# Patient Record
Sex: Male | Born: 1970 | Race: White | Hispanic: No | Marital: Married | Smoking: Never smoker
Health system: Southern US, Community
[De-identification: ages and names within clinical notes are randomized; demographics above are authoritative.]

## PROBLEM LIST (undated history)

## (undated) DIAGNOSIS — I82409 Acute embolism and thrombosis of unspecified deep veins of unspecified lower extremity: Secondary | ICD-10-CM

## (undated) DIAGNOSIS — R1011 Right upper quadrant pain: Secondary | ICD-10-CM

## (undated) HISTORY — DX: Right upper quadrant pain: R10.11

## (undated) HISTORY — DX: Acute embolism and thrombosis of unspecified deep veins of unspecified lower extremity: I82.409

---

## 2006-12-04 ENCOUNTER — Emergency Department (HOSPITAL_COMMUNITY): Admission: EM | Admit: 2006-12-04 | Discharge: 2006-12-04 | Payer: Self-pay | Admitting: Emergency Medicine

## 2008-06-05 ENCOUNTER — Ambulatory Visit: Payer: Self-pay | Admitting: Internal Medicine

## 2008-06-05 DIAGNOSIS — R1011 Right upper quadrant pain: Secondary | ICD-10-CM | POA: Insufficient documentation

## 2008-06-05 LAB — CONVERTED CEMR LAB: Rapid Strep: NEGATIVE

## 2008-06-06 ENCOUNTER — Encounter: Payer: Self-pay | Admitting: Internal Medicine

## 2008-06-09 ENCOUNTER — Telehealth (INDEPENDENT_AMBULATORY_CARE_PROVIDER_SITE_OTHER): Payer: Self-pay | Admitting: *Deleted

## 2008-06-09 ENCOUNTER — Encounter: Admission: RE | Admit: 2008-06-09 | Discharge: 2008-06-09 | Payer: Self-pay | Admitting: Internal Medicine

## 2008-06-09 DIAGNOSIS — R1011 Right upper quadrant pain: Secondary | ICD-10-CM

## 2008-06-09 HISTORY — DX: Right upper quadrant pain: R10.11

## 2008-06-11 ENCOUNTER — Encounter (INDEPENDENT_AMBULATORY_CARE_PROVIDER_SITE_OTHER): Payer: Self-pay | Admitting: *Deleted

## 2008-06-11 ENCOUNTER — Telehealth: Payer: Self-pay | Admitting: Internal Medicine

## 2008-06-12 ENCOUNTER — Ambulatory Visit: Payer: Self-pay | Admitting: Internal Medicine

## 2008-06-12 ENCOUNTER — Telehealth (INDEPENDENT_AMBULATORY_CARE_PROVIDER_SITE_OTHER): Payer: Self-pay | Admitting: *Deleted

## 2008-07-09 ENCOUNTER — Ambulatory Visit: Payer: Self-pay | Admitting: Internal Medicine

## 2008-07-17 LAB — CONVERTED CEMR LAB
ALT: 43 units/L (ref 0–53)
AST: 26 units/L (ref 0–37)
BUN: 14 mg/dL (ref 6–23)
Calcium: 9.4 mg/dL (ref 8.4–10.5)
Creatinine, Ser: 1 mg/dL (ref 0.4–1.5)
GFR calc non Af Amer: 89.26 mL/min (ref >60)
HDL: 33.7 mg/dL — ABNORMAL LOW (ref >39.00)
LDL Cholesterol: 110 mg/dL — ABNORMAL HIGH (ref 0–99)
PSA: 0.71 ng/mL (ref 0.10–4.00)
RBC: 4.94 M/uL (ref 4.22–5.81)
Sodium: 141 meq/L (ref 135–145)
Total CHOL/HDL Ratio: 5
Total Protein: 7.1 g/dL (ref 6.0–8.3)
WBC: 6.9 10*3/uL (ref 4.5–10.5)

## 2008-09-19 ENCOUNTER — Ambulatory Visit: Payer: Self-pay | Admitting: Internal Medicine

## 2008-10-02 ENCOUNTER — Ambulatory Visit: Payer: Self-pay | Admitting: Internal Medicine

## 2008-10-08 ENCOUNTER — Emergency Department (HOSPITAL_COMMUNITY): Admission: EM | Admit: 2008-10-08 | Discharge: 2008-10-08 | Payer: Self-pay | Admitting: Emergency Medicine

## 2008-10-16 ENCOUNTER — Encounter: Payer: Self-pay | Admitting: Internal Medicine

## 2010-03-11 DIAGNOSIS — I82409 Acute embolism and thrombosis of unspecified deep veins of unspecified lower extremity: Secondary | ICD-10-CM

## 2010-03-11 HISTORY — DX: Acute embolism and thrombosis of unspecified deep veins of unspecified lower extremity: I82.409

## 2010-03-25 ENCOUNTER — Emergency Department (HOSPITAL_BASED_OUTPATIENT_CLINIC_OR_DEPARTMENT_OTHER)
Admission: EM | Admit: 2010-03-25 | Discharge: 2010-03-25 | Payer: Self-pay | Source: Home / Self Care | Admitting: Emergency Medicine

## 2010-03-31 ENCOUNTER — Encounter: Payer: Self-pay | Admitting: Internal Medicine

## 2010-03-31 ENCOUNTER — Ambulatory Visit: Payer: Self-pay | Admitting: Internal Medicine

## 2010-03-31 ENCOUNTER — Emergency Department (HOSPITAL_COMMUNITY)
Admission: EM | Admit: 2010-03-31 | Discharge: 2010-03-31 | Payer: Self-pay | Source: Home / Self Care | Admitting: Emergency Medicine

## 2010-03-31 ENCOUNTER — Ambulatory Visit: Payer: Self-pay

## 2010-03-31 DIAGNOSIS — K649 Unspecified hemorrhoids: Secondary | ICD-10-CM | POA: Insufficient documentation

## 2010-03-31 DIAGNOSIS — M79609 Pain in unspecified limb: Secondary | ICD-10-CM | POA: Insufficient documentation

## 2010-03-31 DIAGNOSIS — K429 Umbilical hernia without obstruction or gangrene: Secondary | ICD-10-CM | POA: Insufficient documentation

## 2010-04-01 ENCOUNTER — Telehealth: Payer: Self-pay | Admitting: Internal Medicine

## 2010-04-02 ENCOUNTER — Ambulatory Visit: Payer: Self-pay | Admitting: Internal Medicine

## 2010-04-02 DIAGNOSIS — I82409 Acute embolism and thrombosis of unspecified deep veins of unspecified lower extremity: Secondary | ICD-10-CM | POA: Insufficient documentation

## 2010-04-05 ENCOUNTER — Emergency Department (HOSPITAL_COMMUNITY)
Admission: EM | Admit: 2010-04-05 | Discharge: 2010-04-05 | Payer: Self-pay | Source: Home / Self Care | Admitting: Emergency Medicine

## 2010-04-06 ENCOUNTER — Ambulatory Visit: Payer: Self-pay | Admitting: Internal Medicine

## 2010-04-06 LAB — CONVERTED CEMR LAB
AST: 27 units/L (ref 0–37)
BUN: 17 mg/dL (ref 6–23)
Bilirubin, Direct: 0.1 mg/dL (ref 0.0–0.3)
CO2: 30 meq/L (ref 19–32)
Chloride: 103 meq/L (ref 96–112)
Direct LDL: 130.5 mg/dL
Eosinophils Relative: 1.4 % (ref 0.0–5.0)
Glucose, Bld: 72 mg/dL (ref 70–99)
HDL: 32.7 mg/dL — ABNORMAL LOW (ref >39.00)
INR: 2
Lymphocytes Relative: 38.2 % (ref 12.0–46.0)
Lymphs Abs: 3.7 10*3/uL (ref 0.7–4.0)
Monocytes Absolute: 0.7 10*3/uL (ref 0.1–1.0)
Monocytes Relative: 7.5 % (ref 3.0–12.0)
Neutro Abs: 5.1 10*3/uL (ref 1.4–7.7)
Neutrophils Relative %: 52.5 % (ref 43.0–77.0)
Potassium: 4.2 meq/L (ref 3.5–5.1)
Sodium: 140 meq/L (ref 135–145)
Total CHOL/HDL Ratio: 6
WBC: 9.8 10*3/uL (ref 4.5–10.5)

## 2010-04-08 ENCOUNTER — Ambulatory Visit
Admission: RE | Admit: 2010-04-08 | Discharge: 2010-04-08 | Payer: Self-pay | Source: Home / Self Care | Attending: Internal Medicine | Admitting: Internal Medicine

## 2010-04-08 LAB — CONVERTED CEMR LAB: INR: 2.2

## 2010-04-13 ENCOUNTER — Ambulatory Visit
Admission: RE | Admit: 2010-04-13 | Discharge: 2010-04-13 | Payer: Self-pay | Source: Home / Self Care | Attending: Internal Medicine | Admitting: Internal Medicine

## 2010-04-13 DIAGNOSIS — Z7901 Long term (current) use of anticoagulants: Secondary | ICD-10-CM

## 2010-04-13 LAB — CONVERTED CEMR LAB: INR: 2.4

## 2010-04-22 ENCOUNTER — Ambulatory Visit
Admission: RE | Admit: 2010-04-22 | Discharge: 2010-04-22 | Payer: Self-pay | Source: Home / Self Care | Attending: Internal Medicine | Admitting: Internal Medicine

## 2010-05-02 ENCOUNTER — Encounter: Payer: Self-pay | Admitting: Internal Medicine

## 2010-05-03 ENCOUNTER — Telehealth: Payer: Self-pay | Admitting: Internal Medicine

## 2010-05-07 ENCOUNTER — Ambulatory Visit
Admission: RE | Admit: 2010-05-07 | Discharge: 2010-05-07 | Payer: Self-pay | Source: Home / Self Care | Attending: Internal Medicine | Admitting: Internal Medicine

## 2010-05-13 NOTE — Assessment & Plan Note (Signed)
Summary: PT check/drb  Nurse Visit   Vital Signs:  Patient profile:   40 year old male Height:      69 inches (175.26 cm) Weight:      203.25 pounds (92.39 kg) BMI:     30.12 Temp:     98.4 degrees F (36.89 degrees C) oral BP sitting:   110 / 70  (left arm) Cuff size:   large  Vitals Entered By: [name] CMA (April 02, 2010 10:34 AM) CC: PT check./kb   Current Medications (verified): 1)  Coumadin 5 Mg Tabs (Warfarin Sodium) .Marland Kitchen.. 1 By Mouth Once Daily 2)  Lovenox 100 Mg/ml Soln (Enoxaparin Sodium) .... As Directed  Allergies: No Known Drug Allergies Laboratory Results   Blood Tests   Date/Time Received: [name] [facility]  April 02, 2010 10:34 AM  Date/Time Reported: [name] CMA  April 02, 2010 10:35 AM    INR: 1.1   (Normal Range: 0.88-1.12   Therap INR: 2.0-3.5) Comments: Patient ntoes that he did get the Lovenox. He took 100 this AM, 100 last night, and had 140 the night before. He is also taking Coumadin 5mg  1 by mouth once daily. Per MD the patient was advised to continue the same and re-check Tues. morning. [name] CMA  April 02, 2010 10:36 AM     Orders Added: 1)  Est. Patient Level I [99211] 2)  Protime [16109UE]

## 2010-05-13 NOTE — Miscellaneous (Signed)
Summary: Orders Update  Clinical Lists Changes  Orders: Added new Test order of Venous Duplex Lower Extremity (Venous Duplex Lower) - Signed 

## 2010-05-13 NOTE — Progress Notes (Signed)
Summary: Lovenox pa   Phone Note Call from Patient Call back at Home Phone [phone]   Summary of Call: Patient spouse called stating that insurance requires prior authorization for Lovenox. I spoke with pharmacist who will get clarification of the prescription from hospitalist and send that info (sig instructions, because paz is not here to advise) as well as prior authorization info to our office. Patient spouse is aware that we will do prior authorization, hospitalists do not do that. Initial call taken by: [name] CMA,  April 01, 2010 4:06 PM Initial call taken by: [name] CMA,  April 01, 2010 4:02 PM  Follow-up for Phone Call        Patient came in for PT check today and notes that this was taken care of and he did get the Lovenox. No further action required. Follow-up by: [name] CMA,  April 02, 2010 10:33 AM

## 2010-05-13 NOTE — Assessment & Plan Note (Signed)
Summary: pt check///sph  Nurse Visit   Vital Signs:  Patient profile:   40 year old male Weight:      206.0 pounds Temp:     98.5 degrees F oral BP sitting:   118 / 80  (left arm) Cuff size:   large  Vitals Entered By: Almeta Monas CMA Duncan Dull) (April 08, 2010 9:30 AM)  Current Medications (verified): 1)  Coumadin 5 Mg Tabs (Warfarin Sodium) .Marland Kitchen.. 1 By Mouth Once Daily  Allergies (verified): No Known Drug Allergies Laboratory Results   Blood Tests      INR: 2.2   (Normal Range: 0.88-1.12   Therap INR: 2.0-3.5)    Orders Added: 1)  Est. Patient Level I [16109] 2)  Protime [60454UJ]   ANTICOAGULATION RECORD PREVIOUS REGIMEN & LAB RESULTS   Previous INR:  2.0 on  04/06/2010    NEW REGIMEN & LAB RESULTS Anticoag. Dx: Deep venous thrombosis Current INR: 2.2 Current Coumadin Dose(mg): 5 mg coumadin-1 tab daily Regimen: same  [place]: Willow Ora Repeat testing in: on Tues 04/13/10 Other Comments:    Dose has been reviewed with patient or caretaker during this visit. Reviewed by: Almeta Monas

## 2010-05-13 NOTE — Assessment & Plan Note (Signed)
Summary: pt check///sph  Nurse Visit   Vital Signs:  Patient profile:   40 year old male Height:      69 inches Weight:      204.38 pounds Temp:     98.6 degrees F oral CC: PT check./kb   Allergies: No Known Drug Allergies Laboratory Results   Blood Tests   Date/Time Received: [name] [facility]  April 06, 2010 9:42 AM Date/Time Reported: [name] CMA  April 06, 2010 9:42 AM    INR: 2.0   (Normal Range: 0.88-1.12   Therap INR: 2.0-3.5) Comments: Patient has 5mg  tabs and takes 1 by mouth once daily. He took his last Lovenox shot this AM. He was seen at the ER last night and his level was 1.45. Per MD the patient was made aware to stop the shots, continue same dose of coumadin and re-check in 2 days. [name] CMA  April 06, 2010 9:53 AM     Orders Added: 1)  Est. Patient Level I [99211] 2)  Protime [54098JX]

## 2010-05-13 NOTE — Progress Notes (Signed)
Summary: rehab question  Phone Note Call from Patient Call back at Home Phone [phone]   Summary of Call: Patient left message on triage asking when could he begin rehab for his torn calf/DVT. Please advise. Initial call taken by: [name] CMA,  May 03, 2010 1:21 PM  Follow-up for Phone Call        please arrange a OV, Ilike to see how he is doing [place] E. Kourtni Stineman MD  May 03, 2010 2:36 PM   Additional Follow-up for Phone Call Additional follow up Details #1::        Patient will come on thurs. Additional Follow-up by: [name] CMA,  May 03, 2010 2:59 PM

## 2010-05-13 NOTE — Progress Notes (Signed)
Summary: INR check  Phone Note Outgoing Call   Summary of Call: please check on patient  was seen in the ER and discharged home. was prescribed Lovenox and reportedly Coumadin. Needs an INR check tomorrow Jose E. Paz MD  April 01, 2010 10:28 AM   Follow-up for Phone Call        I spoke w/ pt and he is doing okay coming in tomorrow to check PT .Army Fossa CMA  April 01, 2010 11:14 AM

## 2010-05-13 NOTE — Assessment & Plan Note (Signed)
Summary: pt check and questions  Nurse Visit   Vital Signs:  Patient profile:   40 year old male Height:      69 inches (175.26 cm) Weight:      208 pounds (94.55 kg) BMI:     30.83 Temp:     98.3 degrees F (36.83 degrees C) oral BP sitting:   110 / 72  (left arm) Cuff size:   regular  Vitals Entered By: [name] CMA (April 13, 2010 9:40 AM) CC: PT check./kb   Allergies: No Known Drug Allergies Laboratory Results   Blood Tests   Date/Time Received: [name] [facility]  April 13, 2010 9:40 AM Date/Time Reported: [name] CMA  April 13, 2010 9:40 AM    INR: 2.4   (Normal Range: 0.88-1.12   Therap INR: 2.0-3.5) Comments: Patient notes that he has 5mg  tabs at home and takes one daily. He also would like to know if there is anything that needs to be done before [place] leaves the [place] (i.e. INR check, repeat u/s to make sure clot has dissolved). Patient also notes that he has foot pain when walking (5 of 10 on pain scale) and he notes that he did not have this previously, so he would like to know if it is coming from the clot in his leg. Please advise. [name] CMA  April 13, 2010 9:42 AM  continue with the same dose of Coumadin, recheck INR in 10 days. I don't think he needs a new ultrasound unless the leg swelling is getting worse. For pain, take Tylenol as needed. remind  patient he cannot take Motrin or Motrin-like medicine while he's on Coumadin. for how long is he going out of the country? he needs his Coumadin check in 10 days ! [name] MD  April 14, 2010 10:26 AM    Patient notified. [name] CMA  April 14, 2010 10:59 AM

## 2010-05-13 NOTE — Assessment & Plan Note (Signed)
Summary: PT check/kb  Nurse Visit   Vital Signs:  Patient profile:   40 year old male Height:      69 inches (175.26 cm) Weight:      206.38 pounds (93.81 kg) BMI:     30.59 Temp:     98.1 degrees F (36.72 degrees C) oral BP sitting:   116 / 80  (left arm) Cuff size:   large  Vitals Entered By: [name] CMA (April 22, 2010 10:02 AM) CC: PT check./kb   Allergies: No Known Drug Allergies Laboratory Results   Blood Tests   Date/Time Received: [name] [facility]  April 22, 2010 10:03 AM  Date/Time Reported: [name] CMA  April 22, 2010 10:03 AM    INR: 3.4   (Normal Range: 0.88-1.12   Therap INR: 2.0-3.5) Comments: Patient notes that he has 5mg  tabs and takes 1 tab at bedtime (35mg /week). He is also getting ready to leave the country for 10 days, and is aware I will call him with MD recommendations at [phone]. Please advise. [name] CMA  April 22, 2010 10:05 AM  new dose, 5 mg tablets: One tablet daily, half tablet Tuesday and Fridays (30mg  /week) Recheck in 2 weeks Kazaria Gaertner E. Jakaila Norment MD  April 22, 2010 1:22 PM   Patient notified. [name] CMA  April 22, 2010 4:05 PM     Orders Added: 1)  Est. Patient Level I [95284] 2)  Protime [[id number]] Prescriptions: COUMADIN 5 MG TABS (WARFARIN SODIUM) 1 by mouth once daily  #30 x 3   Entered by:   [name] CMA   Authorized by:   Nolon Rod. Kammi Hechler MD   Signed by:   [name] CMA on 04/22/2010   Method used:   Electronically to        [facility]* (retail)       [address] [place], [place]  [address]       Ph: [phone] or [phone]       Fax: [phone]   RxID:   [id number]

## 2010-05-13 NOTE — Assessment & Plan Note (Signed)
Summary: CPX/CBS   Vital Signs:  Patient profile:   40 year old male Height:      69 inches Weight:      205.38 pounds BMI:     30.44 Pulse rate:   62 / minute Pulse rhythm:   regular BP sitting:   128 / 84  (left arm) Cuff size:   large  Vitals Entered By: Army Fossa CMA (March 31, 2010 1:14 PM) CC: CPX, fasting, Back Pain Comments went to ER was told calf muscle was torn in (L) leg. Happened last thursday [facility]   History of Present Illness: CPX in addition:  worried about a umbilical hernia, going on for a while, no pain.  has hemorrhoids,   from time to time they hurt, burn, get bigger and harder . very rarely he sees some blood  went to ER was told calf muscle was torn in (L) leg. Happened last week, inmediately after he started  to play  basketball  continue w/ pain , has developed swelling     Preventive Screening-Counseling & Management  Caffeine-Diet-Exercise     Does Patient Exercise: no  Current Medications (verified): 1)  None  Allergies (verified): No Known Drug Allergies  Past History:  Past Medical History: Reviewed history from 09/19/2008 and no changes required. h/o Guillain-Barr   (1998) RUQ pain: 3-10 u/s neg   Past Surgical History: Reviewed history from 06/05/2008 and no changes required. 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 CV:  Denies chest pain or discomfort and palpitations. Resp:  Denies cough and shortness of breath. GI:  Denies diarrhea, nausea, and vomiting; RUQ still present on-off . GU:  Denies dysuria, hematuria, urinary frequency, and urinary hesitancy. Psych:  Denies anxiety and  depression.  Physical Exam  General:  alert, well-developed, and overweight-appearing.   Neck:  no masses and no thyromegaly.   Lungs:  normal respiratory effort, no intercostal retractions, no accessory muscle use, and normal breath sounds.   Heart:  normal rate, regular rhythm, no murmur, and no gallop.   Abdomen:  soft, non-tender, no distention, no masses, no guarding, and no rigidity.  has a 3/4 inch umbilical hernia, reducible, nontender Rectal:  2 very small external hemorrhoids noted. Normal sphincter tone. No rectal masses or tenderness. Anoscopy--- 3 internal hemorrhoids, less than half centimeter in size Prostate:  Prostate gland firm and smooth, no enlargement, nodularity, tenderness, mass, asymmetry or induration. Extremities:  left calf a few millimeters greater than the right, slightly tender to palpation. Distal pretibial area on the left is slightly swollen. Right Normal No rash on either side   Impression & Recommendations:  Problem # 1:  HEALTH SCREENING (ICD-V70.0) Td-- 2002 Flu shot-- we don't have any available, recommend to talk  with his pharmacyst  if he desires to have one Diet and exercise discussed Labs  Problem # 2:  LEG PAIN (ICD-729.5) muscle injury? DVT? Plan ---- ultrasound, if negative, refer  2 orthopedic surgery  Orders: Radiology Referral (Radiology)  Problem # 3:  UMBILICAL HERNIA (ICD-553.1) symptoms of incarceration discussed , knows to go to the ER if ever happens At this  point, surgery is elective, he is encouraged to call me if he decides to have that done. Also to call me if the size increase  Problem # 4:  HEMORRHOIDS (ICD-455.6) he has internal hemorrhoids, see instructions  Problem # 5:  RUQ PAIN (ICD-789.01) still has pain from time to time, he will call if ever decides to have a HIDA scan done  Problem # 6:  addendum ultrasound show a left leg DVT that goes beyond the knee. I spoke with the patient I recommend him to go to  the ER at [facility] In my opinion, he needs to be admitted and treated ( or at least be sure he has Lovenox and start Coumadin) Will call the ER and send them  this note Jose E. Paz MD  March 31, 2010 4:27 PM   I spoke with Martie Lee the charge nurse and she is aware that pt is on his way. Faxed over OV. Army Fossa CMA  March 31, 2010 4:31 PM  Other Orders: Venipuncture [id number]) TLB-BMP (Basic Metabolic Panel-BMET) (80048-METABOL) TLB-CBC Platelet - w/Differential (85025-CBCD) TLB-Lipid Panel (80061-LIPID) TLB-TSH (Thyroid Stimulating Hormone) (84443-TSH) TLB-Hepatic/Liver Function Pnl (80076-HEPATIC) Specimen Handling (98119)  Patient Instructions: 1)  Metamucil 2 capsules every day with fluids 2)  If hemorrhoidal discomfort, get over-the-counter nupercainal and hydrocortisone 1%-----> mixed in half-and-half and use it as needed 3)  Call if the hemorrhoidal discomfort is severe 4)  Please schedule a follow-up appointment in 1 year.    Orders Added: 1)  Venipuncture [36415] 2)  TLB-BMP (Basic Metabolic Panel-BMET) [80048-METABOL] 3)  TLB-CBC Platelet - w/Differential [85025-CBCD] 4)  TLB-Lipid Panel [80061-LIPID] 5)  TLB-TSH (Thyroid Stimulating Hormone) [84443-TSH] 6)  TLB-Hepatic/Liver Function Pnl [80076-HEPATIC] 7)  Specimen Handling [99000] 8)  Radiology Referral [Radiology] 9)  Est. Patient Level V [14782] 10)  Est. Patient age 27-39 [1]     Risk Factors:  Exercise:  no

## 2010-05-18 ENCOUNTER — Ambulatory Visit: Payer: 59 | Attending: Internal Medicine | Admitting: Physical Therapy

## 2010-05-18 DIAGNOSIS — M25569 Pain in unspecified knee: Secondary | ICD-10-CM | POA: Insufficient documentation

## 2010-05-18 DIAGNOSIS — IMO0001 Reserved for inherently not codable concepts without codable children: Secondary | ICD-10-CM | POA: Insufficient documentation

## 2010-05-18 DIAGNOSIS — M25669 Stiffness of unspecified knee, not elsewhere classified: Secondary | ICD-10-CM | POA: Insufficient documentation

## 2010-05-19 ENCOUNTER — Encounter: Payer: Self-pay | Admitting: Internal Medicine

## 2010-05-19 NOTE — Assessment & Plan Note (Signed)
Summary: DISCUSS REHAB AND PT CHECK/KB   Vital Signs:  Patient profile:   40 year old male Weight:      206 pounds Pulse rate:   79 / minute BP sitting:   124 / 82  (left arm) Cuff size:   large  Vitals Entered By: Army Fossa CMA (May 07, 2010 8:52 AM) CC: Pt here for f/u visit, PT Check Comments doing better [facility]    History of Present Illness: here for a recheck Doing well Swelling is decreased Now he notices that his left calf is smaller  ROS Tolerating Coumadin well, denies blood in stools or blood in the urine last month, he went to the ER with chest pain, symptoms resolve  Current Medications (verified): 1)  Coumadin 5 Mg Tabs (Warfarin Sodium) .Marland Kitchen.. 1 By Mouth Once Daily  Allergies (verified): No Known Drug Allergies  Past History:  Past Medical History: h/o Guillain-Barr   (1998) RUQ pain: 3-10 u/s neg  DVT L Leg 12-11  Past Surgical History: Reviewed history from 06/05/2008 and no changes required. Denies surgical history  Social History: Reviewed history from 03/31/2010 and no changes required. 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   Extremities:  right leg normal Left leg: No rash No swelling left calf does seem  smaller compared to the right. palpation of the calf muscles and Achilles tendon is within normal limits   Impression & Recommendations:  Problem # 1:  DVT (ICD-453.40) patient had DVT one week after I sprain in the calf, no history of previous DVTs. although the DVT was "provoked", it was provoked by a very mild sprain. I am recommending a total of 4-5 months of Coumadin. Discontinue Coumadin   by May 2012 I also discussed with the patient that he is from now on a higher risk for DVTs and PEs, discussed with him the situations that  increase his risk and the  strategies to prevent it.  Orders: Protime (81191YN)  Problem # 2:  LEG PAIN (ICD-729.5)  he had a sprain in the left calf, now the muscles are smaller. Refer to PT  Orders: Physical Therapy Referral (PT)  Complete Medication List: 1)  Coumadin 5 Mg Tabs (Warfarin sodium) .Marland Kitchen.. 1 by mouth once daily  Patient Instructions: 1)  same coumadin  2)  next INR check in 3 weeks    Orders Added: 1)  Protime [82956OZ] 2)  Physical Therapy Referral [PT] 3)  Est. Patient Level III [99213]      Laboratory Results   Blood Tests      INR: 2.1   (Normal Range: 0.88-1.12   Therap INR: 2.0-3.5) Comments: 5 mg tabs takes 1 tab daily except Tues Fri takes 1/2 tab  -------------------------------- recheck in 3 weeks  same dose  will keep the patient on Coumadin until May 2012, a total of 5 months of therapy Macyn Remmert E. Elinda Bunten MD  May 07, 2010 9:06 AM

## 2010-05-26 ENCOUNTER — Ambulatory Visit: Payer: 59 | Admitting: Physical Therapy

## 2010-05-28 ENCOUNTER — Encounter: Payer: Self-pay | Admitting: Internal Medicine

## 2010-05-28 ENCOUNTER — Ambulatory Visit (INDEPENDENT_AMBULATORY_CARE_PROVIDER_SITE_OTHER): Payer: 59

## 2010-05-28 DIAGNOSIS — Z5181 Encounter for therapeutic drug level monitoring: Secondary | ICD-10-CM

## 2010-05-28 DIAGNOSIS — I82409 Acute embolism and thrombosis of unspecified deep veins of unspecified lower extremity: Secondary | ICD-10-CM

## 2010-05-28 DIAGNOSIS — Z7901 Long term (current) use of anticoagulants: Secondary | ICD-10-CM

## 2010-06-01 ENCOUNTER — Encounter: Payer: 59 | Admitting: Physical Therapy

## 2010-06-02 NOTE — Miscellaneous (Signed)
Summary: Initial Summary for PT Services/[facility]  Initial Summary for PT Services/[facility]   Imported By: Maryln Gottron 05/24/2010 14:12:05  _____________________________________________________________________  External Attachment:    Type:   Image     Comment:   External Document

## 2010-06-02 NOTE — Medication Information (Signed)
Summary: PT CHECK///SPH  Anticoagulant Therapy  PCP: Willow Ora Indication 1: Deep venous thrombosis  Vital Signs: Weight: 207 lbs.  Blood Pressure:  110 / 70   Dietary changes: no    Health status changes: no    Bleeding/hemorrhagic complications: no    Recent/future hospitalizations: no    Any changes in medication regimen? no    Recent/future dental: no  Any missed doses?: yes     Details: Possibly missed a half dose because the pt awoke at 4 am and took on 0.5 dose.  Is patient compliant with meds? yes      Comments: Level was 2.0, patient notes that he has 5mg  tabs and takes 1 tab daily EXCEPT for Tues & Fri when he only takes 0.5 tabs. [name] CMA  May 28, 2010 9:11 AM  no change,next INR 4 weeks ------------------------------[facility] E. Paz MD  May 28, 2010 2:11 PM   I spoke w/ pt he is aware. Army Fossa CMA  May 28, 2010 2:17 PM  .  Allergies: No Known Drug Allergies  Anticoagulation Management History:      Negative risk factors for bleeding include an age less than 7 years old.  The bleeding index is 'low risk'.  Negative CHADS2 values include Age > 69 years old.  His last INR was 2.1 and today's INR is 2.0.    Anticoagulation Management Assessment/Plan:      The patient's current anticoagulation dose is Coumadin 5 mg tabs: 1 by mouth once daily.  The next INR is due on Tues 04/13/10.        Laboratory Results   Blood Tests   Date/Time Received: [name] [facility]  May 28, 2010 9:12 AM  Date/Time Reported: [name] CMA  May 28, 2010 9:12 AM    INR: 2.0   (Normal Range: 0.88-1.12   Therap INR: 2.0-3.5) Comments: Patient notes he has 5mg  tabs at home and takes one tab daily EXCEPT for Tue & Fri when he takes only 0.5 tabs. Patient notes that he went to sleep one night before taking his med and awoke at 4am and took 0.5 tab, so he possibly missed 0.5 dose. He is aware that we will call him with MD recommendations. [name] CMA   May 28, 2010 9:13 AM

## 2010-06-07 ENCOUNTER — Ambulatory Visit: Payer: 59 | Admitting: Physical Therapy

## 2010-06-08 ENCOUNTER — Telehealth: Payer: Self-pay | Admitting: Internal Medicine

## 2010-06-14 ENCOUNTER — Ambulatory Visit: Payer: 59 | Attending: Internal Medicine | Admitting: Physical Therapy

## 2010-06-14 DIAGNOSIS — IMO0001 Reserved for inherently not codable concepts without codable children: Secondary | ICD-10-CM | POA: Insufficient documentation

## 2010-06-14 DIAGNOSIS — M25669 Stiffness of unspecified knee, not elsewhere classified: Secondary | ICD-10-CM | POA: Insufficient documentation

## 2010-06-14 DIAGNOSIS — M25569 Pain in unspecified knee: Secondary | ICD-10-CM | POA: Insufficient documentation

## 2010-06-17 NOTE — Progress Notes (Signed)
Summary: alka seltzer  Phone Note Call from Patient   Complaint: Earache/Ear Infection Summary of Call: Patient left message on triage that he takes coumadin and would like to know if he can use Alka Seltzer daytime or nightime for cold. Please advise. Initial call taken by: [name] CMA,  June 08, 2010 11:12 AM  Follow-up for Phone Call        no Alka Seltzer  or Motrin or Advil,  rather use  rest, fluids, Tylenol and Robitussin-DM. Follow-up by: Nolon Rod. Askari Kinley MD,  June 08, 2010 1:03 PM  Additional Follow-up for Phone Call Additional follow up Details #1::        Patient notified. [name] CMA  June 08, 2010 2:22 PM

## 2010-06-21 ENCOUNTER — Ambulatory Visit: Payer: 59 | Admitting: Physical Therapy

## 2010-06-21 LAB — BASIC METABOLIC PANEL
BUN: 11 mg/dL (ref 6–23)
Creatinine, Ser: 0.95 mg/dL (ref 0.4–1.5)
GFR calc Af Amer: >60 mL/min (ref >60)
Glucose, Bld: 96 mg/dL (ref 70–99)

## 2010-06-21 LAB — DIFFERENTIAL
Eosinophils Relative: 2 % (ref 0–5)
Neutro Abs: 2.9 10*3/uL (ref 1.7–7.7)
Neutrophils Relative %: 42 % — ABNORMAL LOW (ref 43–77)

## 2010-06-21 LAB — CBC
HCT: 47.3 % (ref 39.0–52.0)
Hemoglobin: 16.2 g/dL (ref 13.0–17.0)
MCH: 30.4 pg (ref 26.0–34.0)
MCV: 88.7 fL (ref 78.0–100.0)
Platelets: 319 10*3/uL (ref 150–400)
RBC: 5.33 MIL/uL (ref 4.22–5.81)

## 2010-06-21 LAB — POCT CARDIAC MARKERS
CKMB, poc: <1 ng/mL — ABNORMAL LOW (ref 1.0–8.0)
Myoglobin, poc: 46.7 ng/mL (ref 12–200)

## 2010-06-25 ENCOUNTER — Ambulatory Visit: Payer: 59

## 2010-06-25 ENCOUNTER — Telehealth: Payer: Self-pay | Admitting: Internal Medicine

## 2010-06-28 ENCOUNTER — Encounter: Payer: Self-pay | Admitting: Internal Medicine

## 2010-06-28 ENCOUNTER — Ambulatory Visit (INDEPENDENT_AMBULATORY_CARE_PROVIDER_SITE_OTHER): Payer: 59

## 2010-06-28 ENCOUNTER — Telehealth: Payer: Self-pay | Admitting: Internal Medicine

## 2010-06-28 ENCOUNTER — Ambulatory Visit: Payer: 59 | Admitting: Physical Therapy

## 2010-06-28 DIAGNOSIS — Z5181 Encounter for therapeutic drug level monitoring: Secondary | ICD-10-CM

## 2010-06-28 DIAGNOSIS — I82409 Acute embolism and thrombosis of unspecified deep veins of unspecified lower extremity: Secondary | ICD-10-CM

## 2010-06-28 DIAGNOSIS — Z7901 Long term (current) use of anticoagulants: Secondary | ICD-10-CM

## 2010-06-28 LAB — CONVERTED CEMR LAB: POC INR: 1.8

## 2010-06-29 NOTE — Telephone Encounter (Signed)
error 

## 2010-07-05 ENCOUNTER — Ambulatory Visit: Payer: 59 | Admitting: Physical Therapy

## 2010-07-08 NOTE — Progress Notes (Signed)
  Phone Note Outgoing Call   Summary of Call: due INR,  please arrange Jose E. Paz MD  June 25, 2010 6:18 PM   Follow-up for Phone Call        has appt today. Army Fossa CMA  June 28, 2010 8:53 AM

## 2010-07-08 NOTE — Medication Information (Signed)
Summary: Pt check   PCP: Willow Ora Indication 1: Deep venous thrombosis INR POC 1.8  Vital Signs: Weight: 207 lbs.  Blood Pressure:  116 / 70   Dietary changes: no    Health status changes: no    Bleeding/hemorrhagic complications: no    Recent/future hospitalizations: no    Any changes in medication regimen? no    Recent/future dental: no  Any missed doses?: no       Is patient compliant with meds? yes      Comments: Current dose: 5mg  tabs- 1 daily except for 1/2 on Tues. & Fri.....(30mg Tina Griffiths CMA  June 28, 2010 9:00 AM  pls advise -------------------------------------------- new dose 5mg  tab 1 by mouth once daily 53mg /w) , next INR 2 weeks  Greg Clark E. Kourtni Stineman MD  July 01, 2010 1:54   spoke w/ patient aware of instructions...Marland KitchenMarland KitchenDoristine Devoid CMA  July 01, 2010 2:21 PM   Allergies: No Known Drug Allergies  Anticoagulation Management History:      Negative risk factors for bleeding include an age less than 42 years old.  The bleeding index is 'low risk'.  Negative CHADS2 values include Age > 53 years old.  His last INR was 2.0.  INR POC: 1.8.    Anticoagulation Management Assessment/Plan:      The patient's current anticoagulation dose is Coumadin 5 mg tabs: 1 by mouth once daily.  The next INR is due on Tues 04/13/10.

## 2010-07-22 ENCOUNTER — Ambulatory Visit (INDEPENDENT_AMBULATORY_CARE_PROVIDER_SITE_OTHER): Payer: 59 | Admitting: *Deleted

## 2010-07-22 DIAGNOSIS — I82409 Acute embolism and thrombosis of unspecified deep veins of unspecified lower extremity: Secondary | ICD-10-CM

## 2010-07-22 DIAGNOSIS — Z7901 Long term (current) use of anticoagulants: Secondary | ICD-10-CM

## 2010-07-22 LAB — POCT INR: INR: 2.2

## 2010-07-22 NOTE — Patient Instructions (Addendum)
Pt advised no change recheckc 4 weeks.  -------------------------------------------------------- Agree, recheck in 1 month. To discontinue coumadin by 08-2010 [facility]

## 2010-09-10 ENCOUNTER — Ambulatory Visit (INDEPENDENT_AMBULATORY_CARE_PROVIDER_SITE_OTHER): Payer: 59 | Admitting: Internal Medicine

## 2010-09-10 ENCOUNTER — Encounter: Payer: Self-pay | Admitting: Internal Medicine

## 2010-09-10 DIAGNOSIS — Z7901 Long term (current) use of anticoagulants: Secondary | ICD-10-CM

## 2010-09-10 DIAGNOSIS — I82409 Acute embolism and thrombosis of unspecified deep veins of unspecified lower extremity: Secondary | ICD-10-CM

## 2010-09-10 DIAGNOSIS — M79609 Pain in unspecified limb: Secondary | ICD-10-CM

## 2010-09-10 LAB — POCT INR: INR: 2.7

## 2010-09-10 NOTE — Progress Notes (Signed)
  Subjective:    Patient ID: Greg Clark, male    DOB: [birth year], 40 y.o.   MRN: [id number]  HPI To discuss discontinuation of Coumadin. Feels well.  Past Medical History  Diagnosis Date  . Right upper quadrant pain 3/10    u/s neg  . DVT (deep venous thrombosis) 12/11    L leg   No past surgical history on file.   Review of Systems No nausea, vomiting, blood in the stools. No chest pain or shortness of breath.    Objective:   Physical Exam Alert, oriented, in no apparent distress. Inspection and palpation all day low extremities essentially normal. I meassured the calves, the R calf and  is 1/5 inch larger than the L (he has been using his right calf more lately)       Assessment & Plan:  Today , I spent more than 15 min with the patient, >50% of the time counseling, andreviewing the chart

## 2010-09-10 NOTE — Assessment & Plan Note (Addendum)
Recommend patient to discontinue Coumadin. He had a left leg DVT after a L calf muscle injury consequently this was a provoked DVT (although f/u a minor provocation) Patient aware that from this point on he will be more prone to have DVTs, prevention discussed. He will let me know if he has swelling.

## 2010-09-10 NOTE — Assessment & Plan Note (Addendum)
He had a sprain in the left calf 12-11, had physical therapy 6 times, feels better although not 100%. Occasional pain mostly in the posterior aspect of a proximal left leg. Recommend gradual return to normal exercises

## 2011-01-21 LAB — I-STAT 8, (EC8 V) (CONVERTED LAB)
BUN: 10
Bicarbonate: 25.9 — ABNORMAL HIGH
HCT: 46
Hemoglobin: 15.6
Operator id: 151321
pCO2, Ven: 43.7 — ABNORMAL LOW
pH, Ven: 7.381 — ABNORMAL HIGH

## 2011-01-21 LAB — URINE MICROSCOPIC-ADD ON

## 2011-01-21 LAB — URINALYSIS, ROUTINE W REFLEX MICROSCOPIC
Protein, ur: NEGATIVE
Specific Gravity, Urine: 1.026
Urobilinogen, UA: 0.2
pH: 5.5

## 2011-03-15 ENCOUNTER — Encounter: Payer: 59 | Admitting: Internal Medicine

## 2011-05-05 IMAGING — CT CT ANGIO CHEST
2 of 6 series · 19 of 36 positions shown · IV contrast (APPLIED)
Comparison: 04/05/2010.

CLINICAL DATA: Chest pain.  Pulmonary embolism.

CT ANGIOGRAPHY CHEST WITH CONTRAST
TECHNIQUE: Multidetector CT imaging of the chest was performed
using the standard protocol during bolus administration of
intravenous contrast.  Multiplanar CT image reconstructions
including MIPs were obtained to evaluate the vascular anatomy.
Contrast:  100 ml [id number].

[Series 8: pulm embolism 1.0 b25f thins · axial · 0.72mm/px · z∈[-144,+98]mm · 18 of 270 slices shown]
[im 14/270  lung]
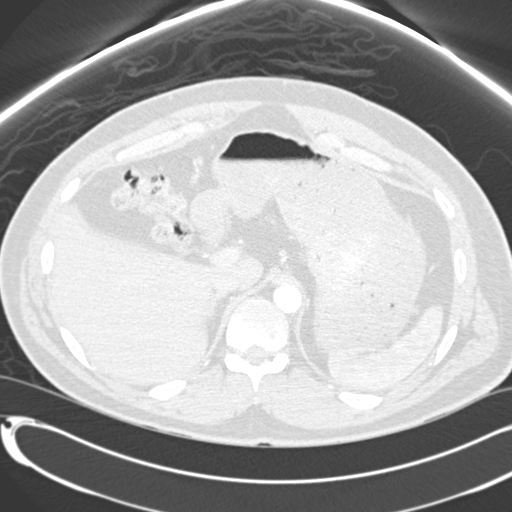
[im 27/270  mediastinal]
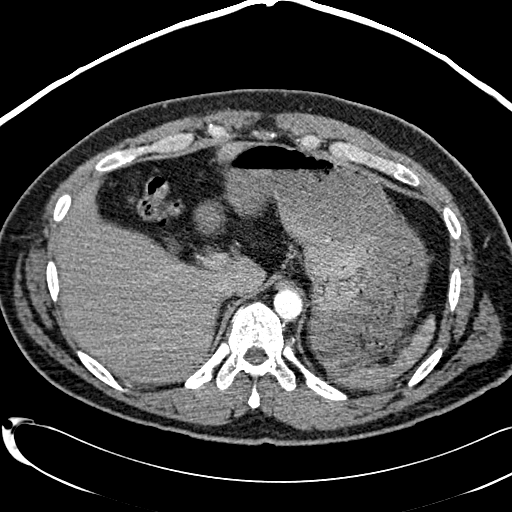
[im 41/270  lung]
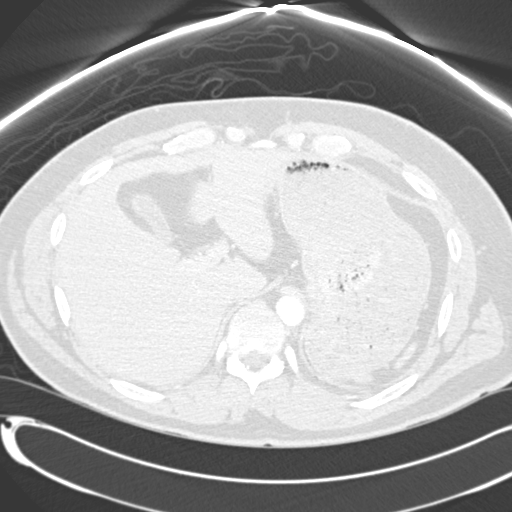
[im 54/270  mediastinal]
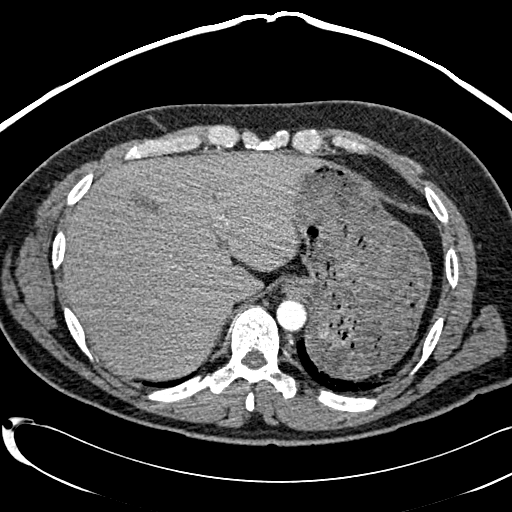
[im 68/270  lung]
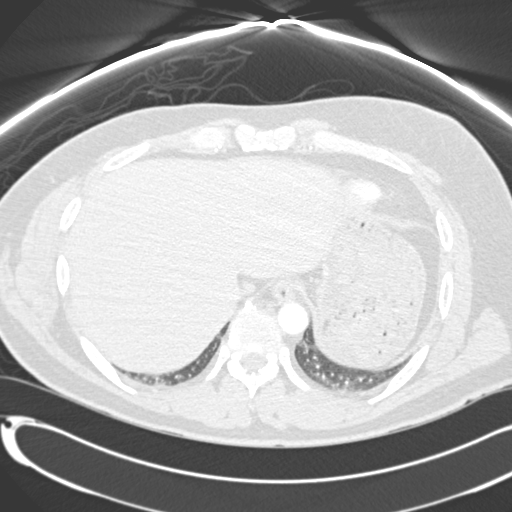
[im 81/270  mediastinal]
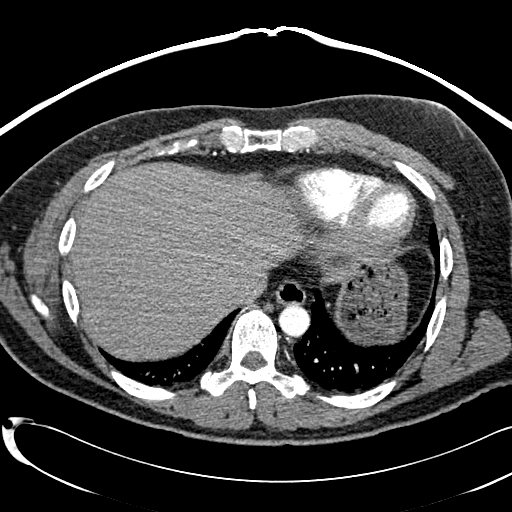
[im 95/270  lung]
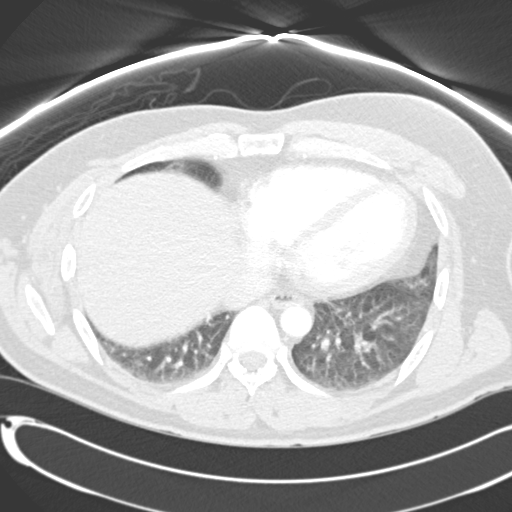
[im 108/270  mediastinal]
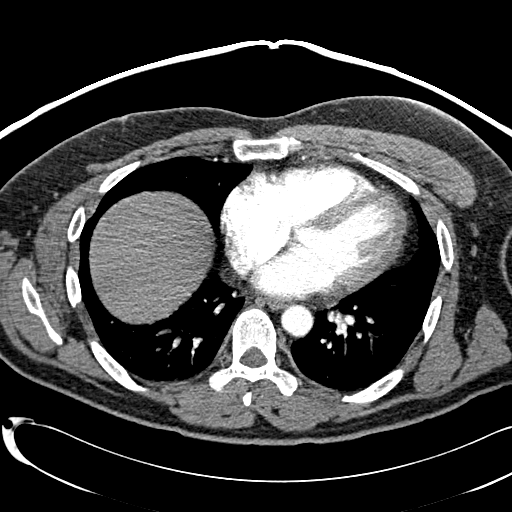
[im 122/270  lung]
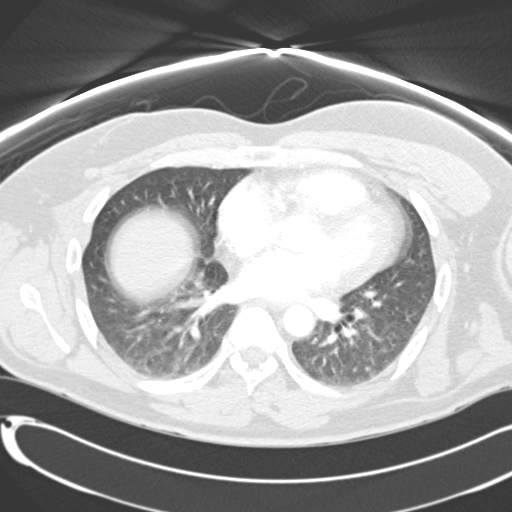
[im 148/270  mediastinal]
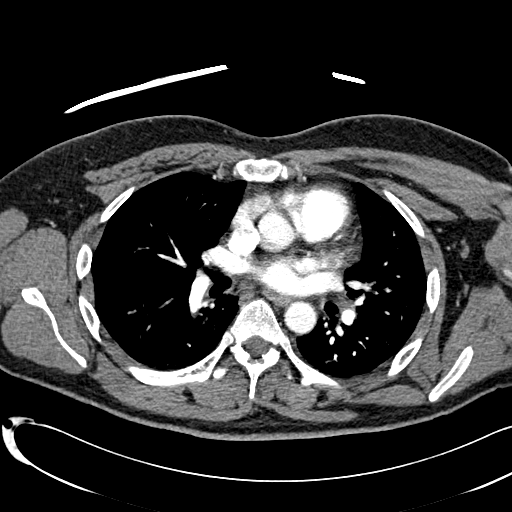
[im 162/270  lung]
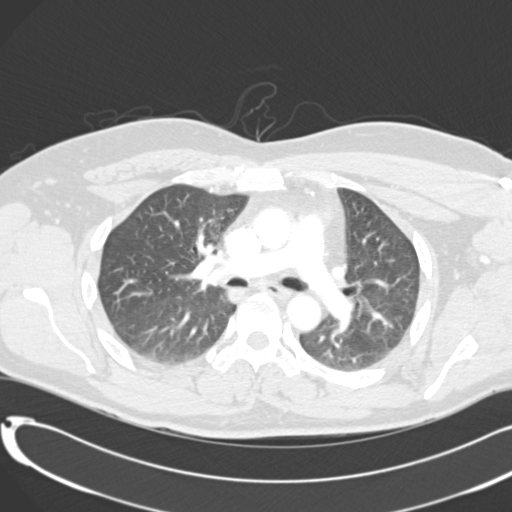
[im 175/270  mediastinal]
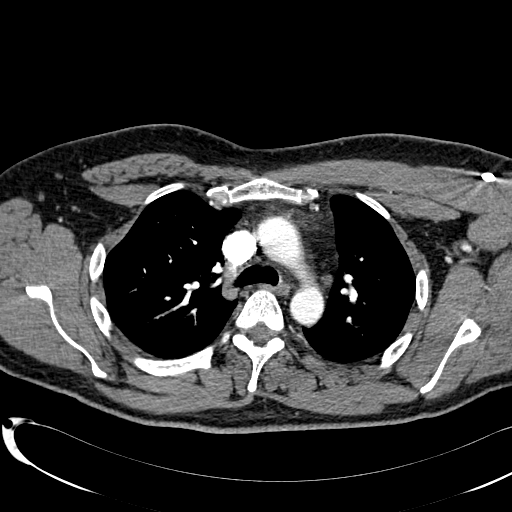
[im 189/270  lung]
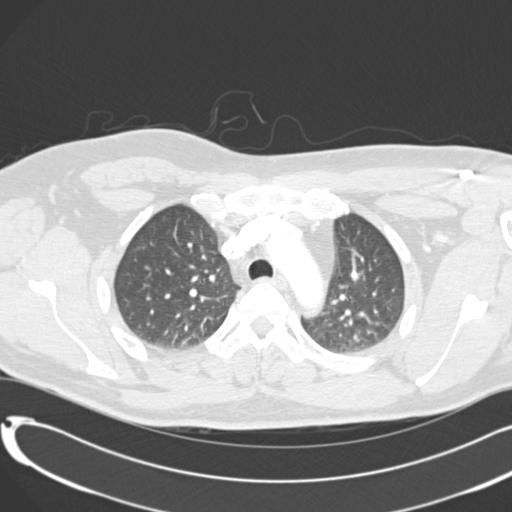
[im 202/270  mediastinal]
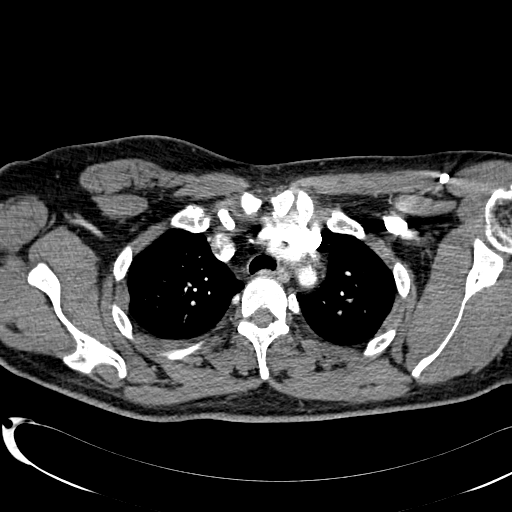
[im 216/270  lung]
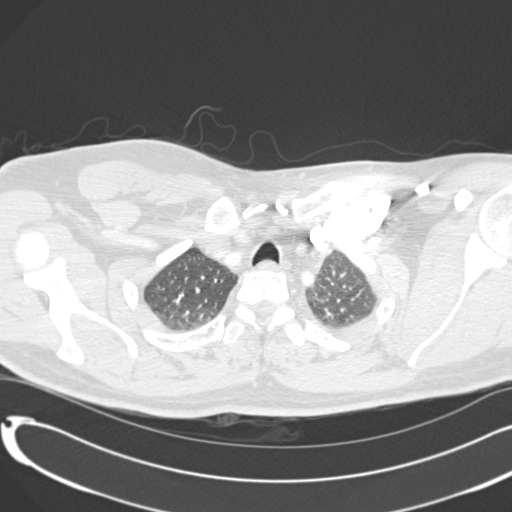
[im 229/270  mediastinal]
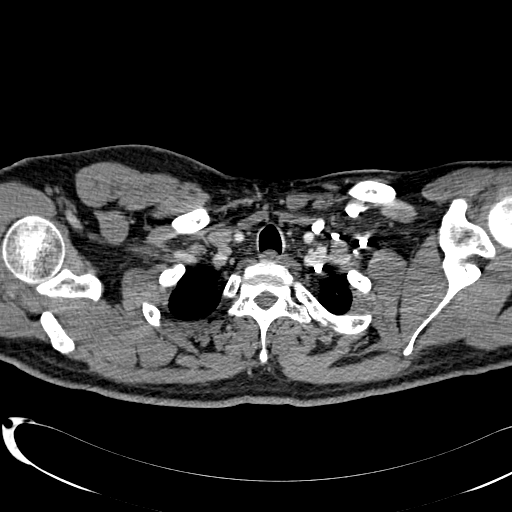
[im 243/270  lung]
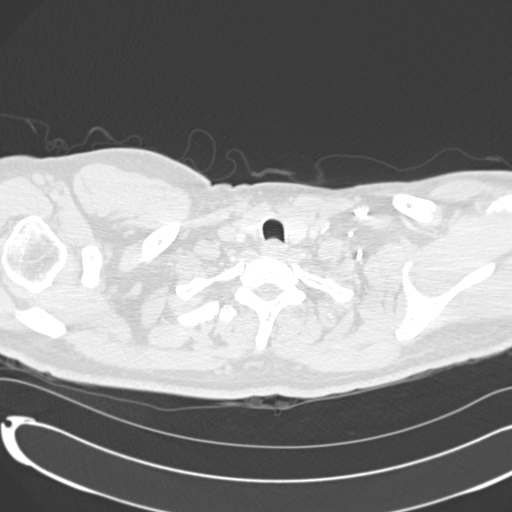
[im 256/270  mediastinal]
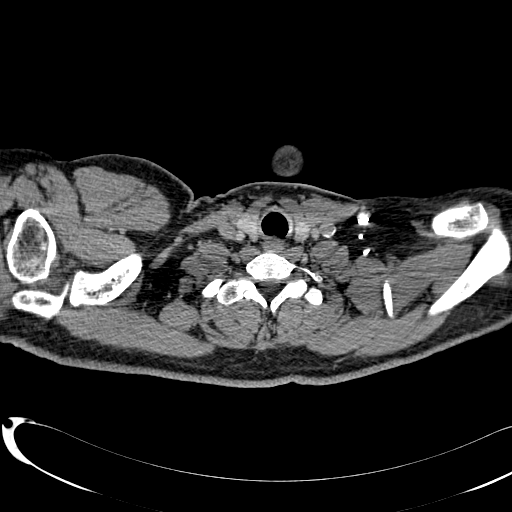

[Series 9: pulm embolism 2.0 spo cor thins · coronal · 0.72mm/px · 1 of 135 slices shown]
[im 68/135  mediastinal]
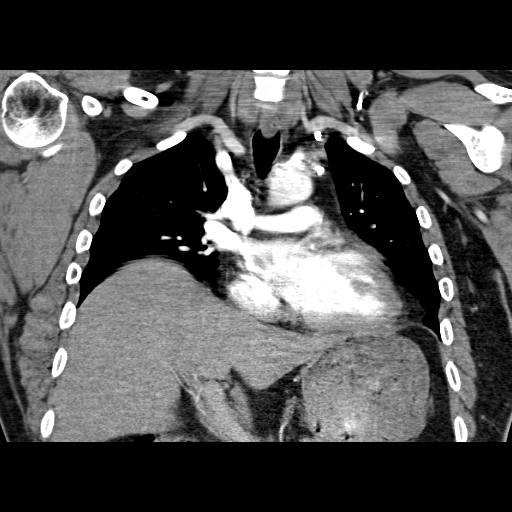

[19 of 36 positions shown; findings below may reference images not displayed]

FINDINGS: Technically adequate study without pulmonary embolus or
acute aortic abnormality.  There is mild respiratory motion
artifact on the examination.  No axillary, mediastinal, or hilar
adenopathy.  No pericardial or pleural effusion.  No aggressive
osseous lesions are present.  Significant dependent atelectasis is
present in the lung bases, which could be associated with shortness
of breath.  Fatty lesion is present in the region of the right
adrenal gland, likely representing benign myelolipoma.

Review of the MIP images confirms the above findings.
IMPRESSION: 1.  Technically adequate study without pulmonary embolus.
2.  Low inspiratory volumes.  No airspace disease.
3.  Respiratory motion artifact.
4.  Probable right adrenal myelolipoma.

## 2011-06-06 ENCOUNTER — Ambulatory Visit (INDEPENDENT_AMBULATORY_CARE_PROVIDER_SITE_OTHER): Payer: PRIVATE HEALTH INSURANCE | Admitting: Internal Medicine

## 2011-06-06 DIAGNOSIS — I82409 Acute embolism and thrombosis of unspecified deep veins of unspecified lower extremity: Secondary | ICD-10-CM

## 2011-06-06 DIAGNOSIS — Z23 Encounter for immunization: Secondary | ICD-10-CM

## 2011-06-06 DIAGNOSIS — Z0000 Encounter for general adult medical examination without abnormal findings: Secondary | ICD-10-CM | POA: Insufficient documentation

## 2011-06-06 LAB — CBC WITH DIFFERENTIAL/PLATELET
Basophils Absolute: 0 10*3/uL (ref 0.0–0.1)
Eosinophils Relative: 1.2 % (ref 0.0–5.0)
HCT: 46 % (ref 39.0–52.0)
Lymphocytes Relative: 50.7 % — ABNORMAL HIGH (ref 12.0–46.0)
Monocytes Relative: 6.7 % (ref 3.0–12.0)
Neutrophils Relative %: 40.8 % — ABNORMAL LOW (ref 43.0–77.0)
Platelets: 340 10*3/uL (ref 150.0–400.0)
RDW: 13.1 % (ref 11.5–14.6)
WBC: 5.8 10*3/uL (ref 4.5–10.5)

## 2011-06-06 LAB — LIPID PANEL
Cholesterol: 181 mg/dL (ref 0–200)
HDL: 33.6 mg/dL — ABNORMAL LOW (ref >39.00)
LDL Cholesterol: 110 mg/dL — ABNORMAL HIGH (ref 0–99)
Triglycerides: 188 mg/dL — ABNORMAL HIGH (ref 0.0–149.0)
VLDL: 37.6 mg/dL (ref 0.0–40.0)

## 2011-06-06 LAB — COMPREHENSIVE METABOLIC PANEL
Albumin: 4.2 g/dL (ref 3.5–5.2)
CO2: 28 mEq/L (ref 19–32)
Calcium: 9.4 mg/dL (ref 8.4–10.5)
Chloride: 107 mEq/L (ref 96–112)
GFR: 93.27 mL/min (ref >60.00)
Glucose, Bld: 87 mg/dL (ref 70–99)
Sodium: 140 mEq/L (ref 135–145)
Total Bilirubin: 0.5 mg/dL (ref 0.3–1.2)
Total Protein: 6.9 g/dL (ref 6.0–8.3)

## 2011-06-06 NOTE — Progress Notes (Signed)
  Subjective:    Patient ID: Greg Clark, male    DOB: 1970-09-29, 41 y.o.   MRN: [id number]  HPI CPX  Past Medical History: h/o Guillain-Barr   (1998) RUQ pain: 3-10 u/s neg  DVT L Leg 12-11, after a sports injury Neck pain, s/p ortho eval ~2008  Past Surgical History: Denies surgical history  Social History: Married, 4 girls  Occupation: truck driving - farming  tobacco--no ETOH-- no2 exercise-- mildly active at work, not exercising   Family History:       CAD - GF      stroke - GF       DM - no       HTN - GF       colon Ca - no       Prostate Ca - GF, uncles x 4        Review of Systems  Constitutional: Negative for fever and fatigue.  Respiratory: Negative for cough and shortness of breath.   Cardiovascular: Negative for chest pain and leg swelling.  Gastrointestinal: Negative for abdominal pain and blood in stool.  Genitourinary: Negative for hematuria and difficulty urinating.  Psychiatric/Behavioral:       No depression or anxiety        Objective:   Physical Exam  Constitutional: He is oriented to person, place, and time. He appears well-developed. No distress.  HENT:  Head: Normocephalic and atraumatic.  Neck: No thyromegaly present.  Cardiovascular: Normal rate, regular rhythm and normal heart sounds.   No murmur heard. Pulmonary/Chest: Effort normal and breath sounds normal. No respiratory distress. He has no wheezes. He has no rales.  Abdominal: Soft. Bowel sounds are normal. He exhibits no distension. There is no tenderness. There is no rebound and no guarding.  Genitourinary: Rectum normal and prostate normal.  Musculoskeletal:       Calves symmetric and not tender, normal pedal pulses, no skin changes   Neurological: He is alert and oriented to person, place, and time.  Skin: He is not diaphoretic.  Psychiatric: He has a normal mood and affect. His behavior is normal. Judgment and thought content normal.      Assessment & Plan:

## 2011-06-06 NOTE — Assessment & Plan Note (Signed)
Td 2002 (?),and today  several family members have prostate ca: DRE today normal, PSA today and every 2 years  Diet-exercise,STE discussed  Labs

## 2011-06-06 NOTE — Assessment & Plan Note (Signed)
L Leg DVT after a mild injury, post DVT ache resolving. Rec compression stokings

## 2011-06-07 ENCOUNTER — Encounter: Payer: Self-pay | Admitting: Internal Medicine

## 2011-06-08 ENCOUNTER — Encounter: Payer: Self-pay | Admitting: *Deleted

## 2011-06-08 NOTE — Telephone Encounter (Signed)
Error

## 2012-05-08 ENCOUNTER — Emergency Department (HOSPITAL_COMMUNITY): Payer: BC Managed Care – PPO

## 2012-05-08 ENCOUNTER — Encounter (HOSPITAL_COMMUNITY): Payer: Self-pay | Admitting: *Deleted

## 2012-05-08 ENCOUNTER — Emergency Department (HOSPITAL_COMMUNITY)
Admission: EM | Admit: 2012-05-08 | Discharge: 2012-05-08 | Disposition: A | Discharge status: Home / Self Care | Payer: BC Managed Care – PPO | Attending: Emergency Medicine | Admitting: Emergency Medicine

## 2012-05-08 DIAGNOSIS — R0789 Other chest pain: Secondary | ICD-10-CM | POA: Insufficient documentation

## 2012-05-08 DIAGNOSIS — Z86718 Personal history of other venous thrombosis and embolism: Secondary | ICD-10-CM | POA: Insufficient documentation

## 2012-05-08 DIAGNOSIS — Z8719 Personal history of other diseases of the digestive system: Secondary | ICD-10-CM | POA: Insufficient documentation

## 2012-05-08 DIAGNOSIS — Z87828 Personal history of other (healed) physical injury and trauma: Secondary | ICD-10-CM | POA: Insufficient documentation

## 2012-05-08 LAB — POCT I-STAT, CHEM 8
BUN: 13 mg/dL (ref 6–23)
Creatinine, Ser: 1.1 mg/dL (ref 0.50–1.35)
Glucose, Bld: 102 mg/dL — ABNORMAL HIGH (ref 70–99)
Hemoglobin: 16.3 g/dL (ref 13.0–17.0)
TCO2: 27 mmol/L (ref 0–100)

## 2012-05-08 LAB — CBC
HCT: 45.9 % (ref 39.0–52.0)
Hemoglobin: 16.5 g/dL (ref 13.0–17.0)
MCHC: 35.9 g/dL (ref 30.0–36.0)
MCV: 88.4 fL (ref 78.0–100.0)
RDW: 13 % (ref 11.5–15.5)

## 2012-05-08 LAB — POCT I-STAT TROPONIN I

## 2012-05-08 NOTE — ED Provider Notes (Signed)
History     CSN: [id number]  Arrival date & time 05/08/12  0509   First MD Initiated Contact with Patient 05/08/12 [phone]      Chief Complaint  Patient presents with  . Chest Pain    (Consider location/radiation/quality/duration/timing/severity/associated sxs/prior treatment) HPI 42 year old male presents to emergency department complaining of pain to his left chest area for the last week. Pain has been constant, is worse with lying flat, or resting. Patient has history of DVT after a calf muscle tear 2 years ago. He had been on Coumadin for 6 months. He has concern for PE. Patient has a family history of coronary disease, father had his first MI in his 15s. Patient without history of hypertension or hyperlipidemia or personal CAD. He is not a smoker. He denies any new calf pain or swelling. Patient reports pain came on one week ago, after finding out a close friend had a heart attack. He denies any pleuritic chest pain, no cough no fever no prolonged immobilization. Pain is not worse with palpation or movement. Pain is a poking stabbing sensation 3/10 Past Medical History  Diagnosis Date  . Right upper quadrant pain 3/10    u/s neg  . DVT (deep venous thrombosis) 12/11    L leg    No past surgical history on file.  Family History  Problem Relation Age of Onset  . Coronary artery disease      GF  . Diabetes Neg Hx   . Hypertension      GF  . Colon cancer Neg Hx   . Prostate cancer      GF,uncles  . Stroke      GF    History  Substance Use Topics  . Smoking status: Never Smoker   . Smokeless tobacco: Not on file  . Alcohol Use: No      Review of Systems  See History of Present Illness; otherwise all other systems are reviewed and negative Allergies  Review of patient's allergies indicates no known allergies.  Home Medications  No current outpatient prescriptions on file.  BP 136/86  Temp 98.2 F (36.8 C) (Oral)  Resp 18  SpO2 97%  Physical Exam  Nursing  note and vitals reviewed. Constitutional: He is oriented to person, place, and time. He appears well-developed and well-nourished.  HENT:  Head: Normocephalic and atraumatic.  Nose: Nose normal.  Mouth/Throat: Oropharynx is clear and moist.  Eyes: Conjunctivae normal and EOM are normal. Pupils are equal, round, and reactive to light.  Neck: Normal range of motion. Neck supple. No JVD present. No tracheal deviation present. No thyromegaly present.  Cardiovascular: Normal rate, regular rhythm, normal heart sounds and intact distal pulses.  Exam reveals no gallop and no friction rub.   No murmur heard. Pulmonary/Chest: Effort normal and breath sounds normal. No stridor. No respiratory distress. He has no wheezes. He has no rales. He exhibits no tenderness.  Abdominal: Soft. Bowel sounds are normal. He exhibits no distension and no mass. There is no tenderness. There is no rebound and no guarding.  Musculoskeletal: Normal range of motion. He exhibits no edema and no tenderness.  Lymphadenopathy:    He has no cervical adenopathy.  Neurological: He is alert and oriented to person, place, and time. He exhibits normal muscle tone. Coordination normal.  Skin: Skin is dry. No rash noted. No erythema. No pallor.  Psychiatric: He has a normal mood and affect. His behavior is normal. Judgment and thought content normal.  ED Course  Procedures (including critical care time)  Labs Reviewed  POCT I-STAT, CHEM 8 - Abnormal; Notable for the following:    Glucose, Bld 102 (*)     All other components within normal limits  CBC  D-DIMER, QUANTITATIVE  POCT I-STAT TROPONIN I   Dg Chest 2 View  05/08/2012  *RADIOLOGY REPORT*  Clinical Data: Left-sided chest pain  CHEST - 2 VIEW  Comparison: 04/05/2010  Findings: Hypoaeration with mild right hemidiaphragm elevation. Lungs otherwise clear.  Heart size within normal limits.  No pleural effusion or pneumothorax.  No acute osseous finding.  IMPRESSION: No  radiographic evidence for acute cardiopulmonary process.   Original Report Authenticated By: Jearld Lesch, M.D.     Date: 05/08/2012  Rate:72  Rhythm: normal sinus rhythm  QRS Axis: normal  Intervals: normal  ST/T Wave abnormalities: normal  Conduction Disutrbances:none  Narrative Interpretation:   Old EKG Reviewed: unchanged    1. Atypical chest pain       MDM  42 year old male with one week of stabbing, poking chest pain. He is at slightly higher risk for DVT or PE given his hat past history of DVT, but not having any ongoing symptoms and prior DVT prompted by muscle tear. Exam unremarkable, EKG unremarkable. Chest x-ray unremarkable. D-dimer is negative. Do not feel patient is at risk for PE. I do not feel symptoms are consistent with ACS. Will have patient start full-strength aspirin, and have him follow up with primary care Dr. for referral to cardiology as needed        Olivia Mackie, MD 05/08/12 [phone]

## 2012-05-08 NOTE — ED Notes (Addendum)
Here for > one week history of chest pain that feels sharp. States pain is worse when lying flat. No precipitating or alleviating factors. Had similar episode and had a blood clot in leg and also checked for clot in lung and test was negative.

## 2012-05-26 ENCOUNTER — Other Ambulatory Visit: Payer: Self-pay

## 2012-07-17 ENCOUNTER — Encounter: Payer: BC Managed Care – PPO | Admitting: Internal Medicine

## 2013-02-14 ENCOUNTER — Other Ambulatory Visit: Payer: Self-pay

## 2013-05-18 ENCOUNTER — Encounter (HOSPITAL_COMMUNITY): Payer: Self-pay | Admitting: Emergency Medicine

## 2013-05-18 ENCOUNTER — Emergency Department (HOSPITAL_COMMUNITY)
Admission: EM | Admit: 2013-05-18 | Discharge: 2013-05-18 | Disposition: A | Discharge status: Home / Self Care | Payer: BC Managed Care – PPO | Attending: Emergency Medicine | Admitting: Emergency Medicine

## 2013-05-18 ENCOUNTER — Emergency Department (HOSPITAL_COMMUNITY): Payer: BC Managed Care – PPO

## 2013-05-18 DIAGNOSIS — Z86718 Personal history of other venous thrombosis and embolism: Secondary | ICD-10-CM | POA: Insufficient documentation

## 2013-05-18 DIAGNOSIS — Z7982 Long term (current) use of aspirin: Secondary | ICD-10-CM | POA: Insufficient documentation

## 2013-05-18 DIAGNOSIS — R079 Chest pain, unspecified: Secondary | ICD-10-CM | POA: Insufficient documentation

## 2013-05-18 DIAGNOSIS — R002 Palpitations: Secondary | ICD-10-CM | POA: Insufficient documentation

## 2013-05-18 LAB — BASIC METABOLIC PANEL WITH GFR
BUN: 13 mg/dL (ref 6–23)
CO2: 24 meq/L (ref 19–32)
Calcium: 9.2 mg/dL (ref 8.4–10.5)
Chloride: 103 meq/L (ref 96–112)
Creatinine, Ser: 0.95 mg/dL (ref 0.50–1.35)
GFR calc Af Amer: >90 mL/min
GFR calc non Af Amer: >90 mL/min
Glucose, Bld: 93 mg/dL (ref 70–99)
Potassium: 4.7 meq/L (ref 3.7–5.3)
Sodium: 140 meq/L (ref 137–147)

## 2013-05-18 LAB — POCT I-STAT TROPONIN I
TROPONIN I, POC: 0.01 ng/mL (ref 0.00–0.08)
Troponin i, poc: 0.01 ng/mL (ref 0.00–0.08)

## 2013-05-18 LAB — CBC
HCT: 45.1 % (ref 39.0–52.0)
Hemoglobin: 15.4 g/dL (ref 13.0–17.0)
MCH: 30.1 pg (ref 26.0–34.0)
MCHC: 34.1 g/dL (ref 30.0–36.0)
MCV: 88.3 fL (ref 78.0–100.0)
Platelets: 302 K/uL (ref 150–400)
RBC: 5.11 MIL/uL (ref 4.22–5.81)
RDW: 12.8 % (ref 11.5–15.5)
WBC: 6.7 K/uL (ref 4.0–10.5)

## 2013-05-18 NOTE — ED Notes (Signed)
Pt. Stated, I'v had about 3 of the episodes of left side where my heart is of pain and it started again last night.  I also had some pain into my left side of my face and jaw that was new.  I had blood clots from an accident about 4 years ago and took coumadin and my dad and his brother have had some heart issues.  Im suppose to have a stress test.

## 2013-05-18 NOTE — Discharge Instructions (Signed)
Used TUMS, Rolaids or Maalox before meals and at bedtime for 5 days. Use a proton pump inhibitor, like Nexium or Prilosec; also known as omeprazole, daily for one month. Return here, if needed, for problems.    Chest Pain (Nonspecific) It is often hard to give a specific diagnosis for the cause of chest pain. There is always a chance that your pain could be related to something serious, such as a heart attack or a blood clot in the lungs. You need to follow up with your caregiver for further evaluation. CAUSES   Heartburn.  Pneumonia or bronchitis.  Anxiety or stress.  Inflammation around your heart (pericarditis) or lung (pleuritis or pleurisy).  A blood clot in the lung.  A collapsed lung (pneumothorax). It can develop suddenly on its own (spontaneous pneumothorax) or from injury (trauma) to the chest.  Shingles infection (herpes zoster virus). The chest wall is composed of bones, muscles, and cartilage. Any of these can be the source of the pain.  The bones can be bruised by injury.  The muscles or cartilage can be strained by coughing or overwork.  The cartilage can be affected by inflammation and become sore (costochondritis). DIAGNOSIS  Lab tests or other studies, such as X-rays, electrocardiography, stress testing, or cardiac imaging, may be needed to find the cause of your pain.  TREATMENT   Treatment depends on what may be causing your chest pain. Treatment may include:  Acid blockers for heartburn.  Anti-inflammatory medicine.  Pain medicine for inflammatory conditions.  Antibiotics if an infection is present.  You may be advised to change lifestyle habits. This includes stopping smoking and avoiding alcohol, caffeine, and chocolate.  You may be advised to keep your head raised (elevated) when sleeping. This reduces the chance of acid going backward from your stomach into your esophagus.  Most of the time, nonspecific chest pain will improve within 2 to 3  days with rest and mild pain medicine. HOME CARE INSTRUCTIONS   If antibiotics were prescribed, take your antibiotics as directed. Finish them even if you start to feel better.  For the next few days, avoid physical activities that bring on chest pain. Continue physical activities as directed.  Do not smoke.  Avoid drinking alcohol.  Only take over-the-counter or prescription medicine for pain, discomfort, or fever as directed by your caregiver.  Follow your caregiver's suggestions for further testing if your chest pain does not go away.  Keep any follow-up appointments you made. If you do not go to an appointment, you could develop lasting (chronic) problems with pain. If there is any problem keeping an appointment, you must call to reschedule. SEEK MEDICAL CARE IF:   You think you are having problems from the medicine you are taking. Read your medicine instructions carefully.  Your chest pain does not go away, even after treatment.  You develop a rash with blisters on your chest. SEEK IMMEDIATE MEDICAL CARE IF:   You have increased chest pain or pain that spreads to your arm, neck, jaw, back, or abdomen.  You develop shortness of breath, an increasing cough, or you are coughing up blood.  You have severe back or abdominal pain, feel nauseous, or vomit.  You develop severe weakness, fainting, or chills.  You have a fever. THIS IS AN EMERGENCY. Do not wait to see if the pain will go away. Get medical help at once. Call your local emergency services (911 in [place] Do not drive yourself to the hospital. MAKE SURE YOU:  Understand these instructions.  Will watch your condition.  Will get help right away if you are not doing well or get worse. Document Released: 01/05/2005 Document Revised: 06/20/2011 Document Reviewed: 11/01/2007 [facility] Patient Information 2014 [place].

## 2013-05-18 NOTE — ED Provider Notes (Signed)
CSN: [id number]     Arrival date & time 05/18/13  0705 History   First MD Initiated Contact with Patient 05/18/13 [phone]     Chief Complaint  Patient presents with  . Chest Pain   (Consider location/radiation/quality/duration/timing/severity/associated sxs/prior Treatment) Patient is a 43 y.o. male presenting with chest pain. The history is provided by the patient.  Chest Pain  He complains of chest pain, for 3 weeks that has been on and off. In the last 12 hours. It has been constant, and kept him up all night. The pain has been waxing and waning during the last 12 hours, going from 3-6/10. The pain is dull. The pain does not change with exertion. He tried running up and down the stairs 5 times and it did not change. The pain, but he felt his heart racing. He denies weakness, dizziness, nausea, vomiting, diaphoresis, cough, hemoptysis, chest wall pain, or back pain. No recent trauma or surgery.  He presents for evaluation by private vehicle. He saw his PCP 2 weeks ago for the pain, and was referred to cardiology. He has a followup appointment scheduled with cardiology in 5 days. He expects that he will get a stress test. There are no other known modifying factors.  Past Medical History  Diagnosis Date  . Right upper quadrant pain 3/10    u/s neg  . DVT (deep venous thrombosis) 12/11    L leg   History reviewed. No pertinent past surgical history. Family History  Problem Relation Age of Onset  . Coronary artery disease      GF  . Diabetes Neg Hx   . Hypertension      GF  . Colon cancer Neg Hx   . Prostate cancer      GF,uncles  . Stroke      GF   History  Substance Use Topics  . Smoking status: Never Smoker   . Smokeless tobacco: Not on file  . Alcohol Use: No    Review of Systems  Cardiovascular: Positive for chest pain.  All other systems reviewed and are negative.    Allergies  Review of patient's allergies indicates no known allergies.  Home Medications   Current  Outpatient Rx  Name  Route  Sig  Dispense  Refill  . aspirin EC 81 MG tablet   Oral   Take 486 mg by mouth once.          BP 127/78  Pulse 64  Temp(Src) 98.3 F (36.8 C) (Oral)  Resp 16  SpO2 99% Physical Exam  Nursing note and vitals reviewed. Constitutional: He is oriented to person, place, and time. He appears well-developed and well-nourished. No distress.  HENT:  Head: Normocephalic and atraumatic.  Right Ear: External ear normal.  Left Ear: External ear normal.  Eyes: Conjunctivae and EOM are normal. Pupils are equal, round, and reactive to light.  Neck: Normal range of motion and phonation normal. Neck supple.  Cardiovascular: Normal rate, regular rhythm, normal heart sounds and intact distal pulses.   Pulmonary/Chest: Effort normal and breath sounds normal. No respiratory distress. He has no wheezes. He exhibits no tenderness and no bony tenderness.  Abdominal: Soft. Normal appearance. There is no tenderness.  Musculoskeletal: Normal range of motion. He exhibits no edema and no tenderness.  No unilateral lower extremity swelling  Neurological: He is alert and oriented to person, place, and time. No cranial nerve deficit or sensory deficit. He exhibits normal muscle tone. Coordination normal.  Skin: Skin is  warm, dry and intact.  Psychiatric: He has a normal mood and affect. His behavior is normal. Judgment and thought content normal.    ED Course  Procedures (including critical care time) Medications - No data to display  Patient Vitals for the past 24 hrs:  BP Temp Temp src Pulse Resp SpO2  05/18/13 1305 127/78 mmHg - - 64 16 99 %  05/18/13 1226 109/78 mmHg - - 61 - 100 %  05/18/13 1115 110/74 mmHg - - 60 16 99 %  05/18/13 1030 104/51 mmHg - - 61 - 99 %  05/18/13 1015 114/61 mmHg - - 64 - 98 %  05/18/13 1000 116/77 mmHg - - 66 - 97 %  05/18/13 0945 118/78 mmHg - - 63 - 98 %  05/18/13 0930 117/75 mmHg - - 69 15 98 %  05/18/13 0915 115/68 mmHg - - 70 16 99 %   05/18/13 0900 112/78 mmHg - - 68 20 100 %  05/18/13 0845 117/72 mmHg - - 64 16 99 %  05/18/13 0830 120/85 mmHg - - 65 17 97 %  05/18/13 0815 123/85 mmHg - - 66 11 98 %  05/18/13 0800 119/76 mmHg - - 60 12 97 %  05/18/13 0753 127/73 mmHg - - 63 13 98 %  05/18/13 0709 133/90 mmHg 98.3 F (36.8 C) Oral 63 22 98 %    1:0o PM Reevaluation with update and discussion. After initial assessment and treatment, an updated evaluation reveals he is comfortable. He denies chest pain. He has not had any additional palpitations.Mancel Bale L     Labs Review Labs Reviewed  CBC  BASIC METABOLIC PANEL  POCT I-STAT TROPONIN I  POCT I-STAT TROPONIN I   Imaging Review Dg Chest Port 1 View  05/18/2013   CLINICAL DATA:  Chest pain for 3 weeks.  EXAM: PORTABLE CHEST - 1 VIEW  COMPARISON:  05/08/2012  FINDINGS: Chronic mild elevation of the right hemidiaphragm. Both lungs are clear. Heart and mediastinum are within normal limits. Trachea is midline  IMPRESSION: No acute cardiopulmonary disease.   Electronically Signed   By: Richarda Overlie M.D.   On: 05/18/2013 08:33    EKG Interpretation    Date/Time:  Saturday May 18 2013 07:09:56 EST Ventricular Rate:  56 PR Interval:  204 QRS Duration: 86 QT Interval:  376 QTC Calculation: 362 R Axis:   62 Text Interpretation:  Sinus bradycardia Otherwise normal ECG Since last tracing rate slower Confirmed by Luzmaria Devaux  MD, Koa Palla ([id number]) on 05/18/2013 7:56:09 AM            MDM   1. Chest pain   2. Palpitations    Nonspecific chest pain with palpitations. Negative EP evaluation for cardiac injury or pneumonia.    Nursing Notes Reviewed/ Care Coordinated Applicable Imaging Reviewed Interpretation of Laboratory Data incorporated into ED treatment  The patient appears reasonably screened and/or stabilized for discharge and I doubt any other medical condition or other [facility] requiring further screening, evaluation, or treatment in the ED at this time prior  to discharge.  Plan: Home Medications- Antacid, PPI of choice; Home Treatments- rest; return here if the recommended treatment, does not improve the symptoms; Recommended follow up- PCP and Cardiology as scheduled   Flint Melter, MD 05/18/13 1310

## 2014-06-17 IMAGING — CR DG CHEST 1V PORT
1 series · 1 of 1 positions shown · non-contrast
Comparison: 05/08/2012

CLINICAL DATA: Chest pain for 3 weeks.

EXAM:
PORTABLE CHEST - 1 VIEW

[AP]
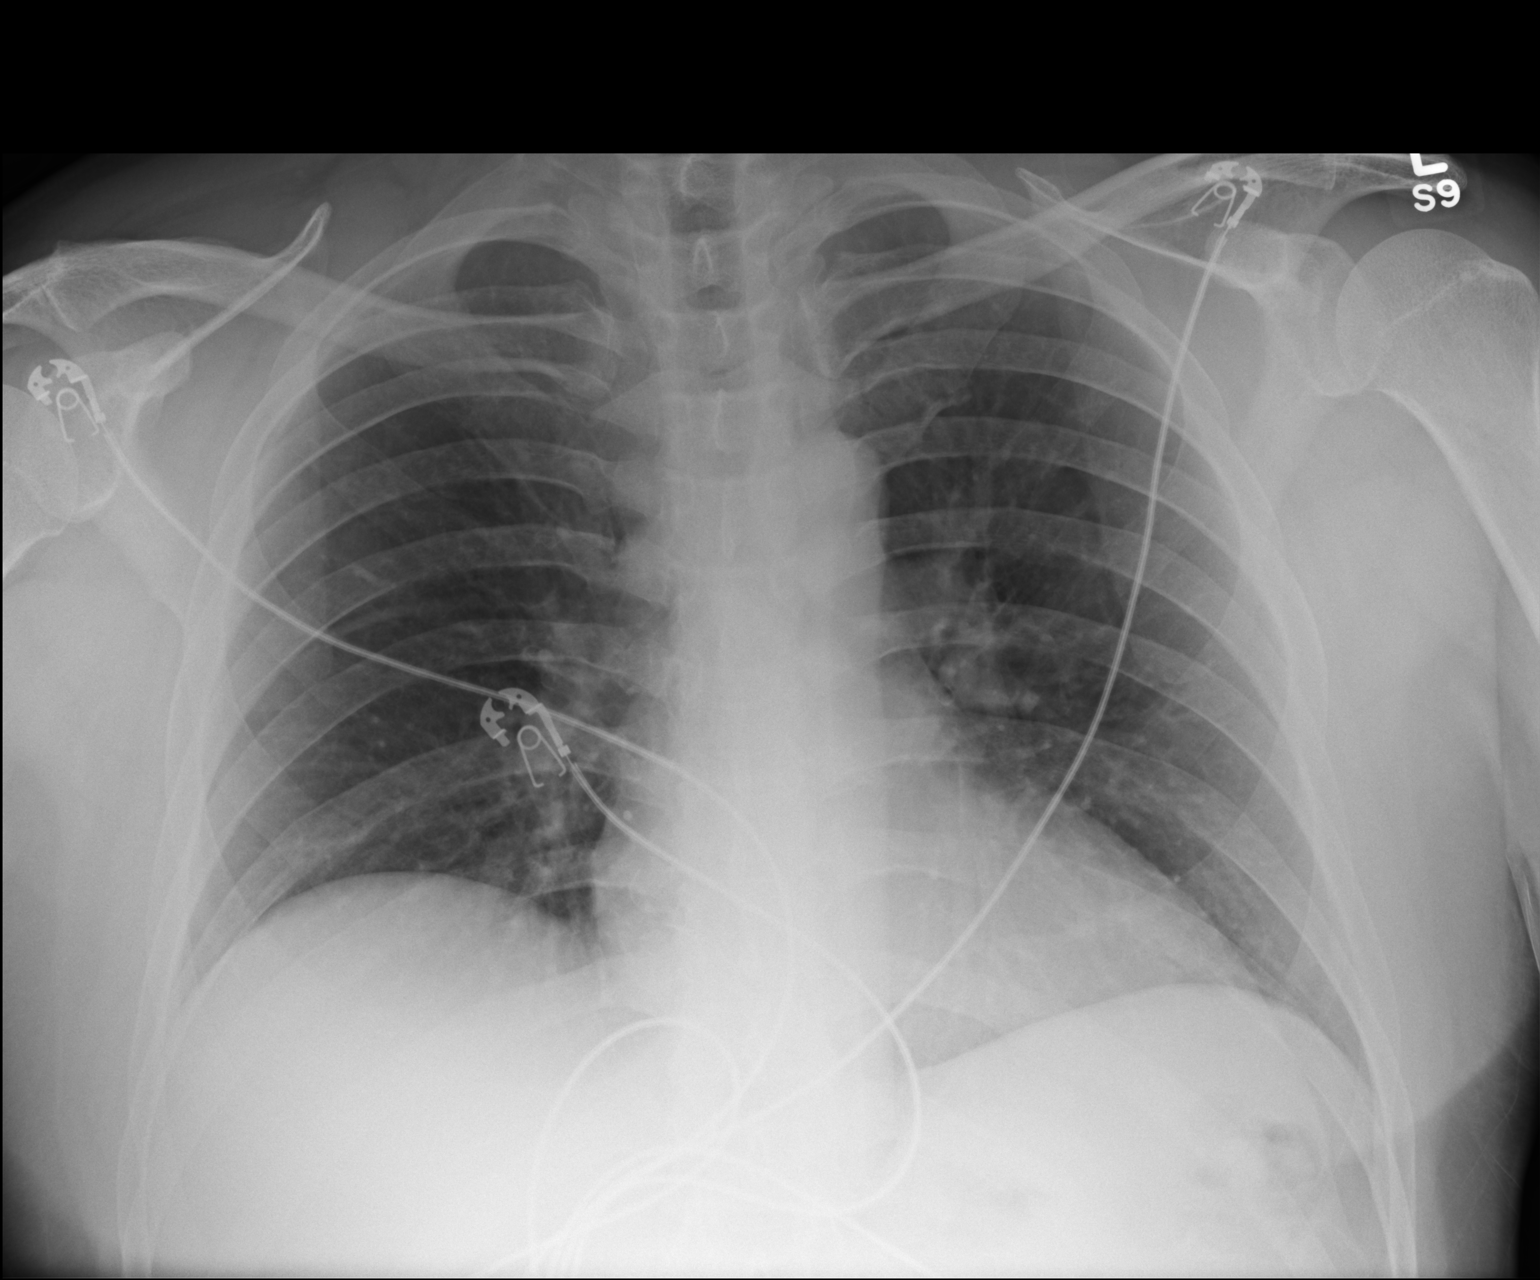

[1 of 1 positions shown; findings below may reference images not displayed]

FINDINGS: Chronic mild elevation of the right hemidiaphragm. Both lungs are
clear. Heart and mediastinum are within normal limits. Trachea is
midline
IMPRESSION: No acute cardiopulmonary disease.

## 2015-08-25 ENCOUNTER — Encounter: Payer: Self-pay | Admitting: Gastroenterology

## 2015-09-17 ENCOUNTER — Ambulatory Visit: Payer: Self-pay | Admitting: Gastroenterology

## 2016-03-09 ENCOUNTER — Other Ambulatory Visit: Payer: Self-pay | Admitting: Family Medicine

## 2016-03-09 DIAGNOSIS — R1084 Generalized abdominal pain: Secondary | ICD-10-CM

## 2016-03-18 ENCOUNTER — Other Ambulatory Visit: Payer: BLUE CROSS/BLUE SHIELD

## 2016-03-22 ENCOUNTER — Other Ambulatory Visit: Payer: BLUE CROSS/BLUE SHIELD

## 2022-10-17 ENCOUNTER — Emergency Department (HOSPITAL_COMMUNITY)
Admission: EM | Admit: 2022-10-17 | Discharge: 2022-10-17 | Disposition: A | Discharge status: Home / Self Care | Payer: BC Managed Care – PPO | Attending: Emergency Medicine | Admitting: Emergency Medicine

## 2022-10-17 ENCOUNTER — Emergency Department (HOSPITAL_COMMUNITY): Payer: BC Managed Care – PPO

## 2022-10-17 ENCOUNTER — Telehealth: Payer: Self-pay

## 2022-10-17 ENCOUNTER — Telehealth (HOSPITAL_COMMUNITY): Payer: Self-pay | Admitting: Student

## 2022-10-17 ENCOUNTER — Other Ambulatory Visit: Payer: Self-pay

## 2022-10-17 DIAGNOSIS — R197 Diarrhea, unspecified: Secondary | ICD-10-CM | POA: Diagnosis present

## 2022-10-17 DIAGNOSIS — K529 Noninfective gastroenteritis and colitis, unspecified: Secondary | ICD-10-CM | POA: Insufficient documentation

## 2022-10-17 LAB — CBC
HCT: 45.7 % (ref 39.0–52.0)
Hemoglobin: 15.6 g/dL (ref 13.0–17.0)
MCH: 29.9 pg (ref 26.0–34.0)
MCHC: 34.1 g/dL (ref 30.0–36.0)
MCV: 87.7 fL (ref 80.0–100.0)
Platelets: 284 10*3/uL (ref 150–400)
RBC: 5.21 MIL/uL (ref 4.22–5.81)
RDW: 12.6 % (ref 11.5–15.5)
WBC: 8 10*3/uL (ref 4.0–10.5)
nRBC: 0 % (ref 0.0–0.2)

## 2022-10-17 LAB — URINALYSIS, ROUTINE W REFLEX MICROSCOPIC
Bilirubin Urine: NEGATIVE
Glucose, UA: NEGATIVE mg/dL
Ketones, ur: 80 mg/dL — AB
Leukocytes,Ua: NEGATIVE
Nitrite: NEGATIVE
Protein, ur: 30 mg/dL — AB
Specific Gravity, Urine: 1.028 (ref 1.005–1.030)
pH: 5 (ref 5.0–8.0)

## 2022-10-17 LAB — COMPREHENSIVE METABOLIC PANEL
ALT: 26 U/L (ref 0–44)
AST: 24 U/L (ref 15–41)
Albumin: 3.6 g/dL (ref 3.5–5.0)
Alkaline Phosphatase: 46 U/L (ref 38–126)
Anion gap: 12 (ref 5–15)
BUN: 13 mg/dL (ref 6–20)
CO2: 23 mmol/L (ref 22–32)
Calcium: 8.7 mg/dL — ABNORMAL LOW (ref 8.9–10.3)
Chloride: 100 mmol/L (ref 98–111)
Creatinine, Ser: 1.14 mg/dL (ref 0.61–1.24)
GFR, Estimated: >60 mL/min (ref >60)
Glucose, Bld: 100 mg/dL — ABNORMAL HIGH (ref 70–99)
Potassium: 3.8 mmol/L (ref 3.5–5.1)
Sodium: 135 mmol/L (ref 135–145)
Total Bilirubin: 0.8 mg/dL (ref 0.3–1.2)
Total Protein: 6.9 g/dL (ref 6.5–8.1)

## 2022-10-17 LAB — POC OCCULT BLOOD, ED: Fecal Occult Bld: NEGATIVE

## 2022-10-17 LAB — LIPASE, BLOOD: Lipase: 29 U/L (ref 11–51)

## 2022-10-17 MED ORDER — OXYCODONE-ACETAMINOPHEN 5-325 MG PO TABS: 1.0000 | ORAL_TABLET | Freq: Four times a day (QID) | ORAL | 0 refills | Status: AC | PRN

## 2022-10-17 MED ORDER — ONDANSETRON HCL 4 MG PO TABS: 4.0000 mg | ORAL_TABLET | Freq: Four times a day (QID) | ORAL | 0 refills | Status: AC

## 2022-10-17 MED ORDER — AMOXICILLIN-POT CLAVULANATE 875-125 MG PO TABS: 1.0000 | ORAL_TABLET | Freq: Two times a day (BID) | ORAL | 0 refills | Status: AC

## 2022-10-17 MED ORDER — IOHEXOL 350 MG/ML SOLN
75.0000 mL | Freq: Once | INTRAVENOUS | Status: AC | PRN
  Administered 2022-10-17: 75 mL via INTRAVENOUS

## 2022-10-17 MED ORDER — MORPHINE SULFATE (PF) 4 MG/ML IV SOLN
4.0000 mg | Freq: Once | INTRAVENOUS | Status: DC
  Filled 2022-10-17: qty 1

## 2022-10-17 MED ORDER — OXYCODONE-ACETAMINOPHEN 5-325 MG PO TABS: 10.0000 | ORAL_TABLET | Freq: Four times a day (QID) | ORAL | 0 refills | Status: DC | PRN

## 2022-10-17 MED ORDER — ONDANSETRON HCL 4 MG/2ML IJ SOLN
4.0000 mg | Freq: Once | INTRAMUSCULAR | Status: DC
  Filled 2022-10-17: qty 2

## 2022-10-17 NOTE — Telephone Encounter (Signed)
Received call from Charlie with [facility] re: oxycodone-acetaminophen 5-325mg  tablet, 10 tablets PO every 6 hours PRN. Charlie w/pharmacy will delete prescription. This RNCM notified EDP to update prescription. Per EDP he will update and resend.  No additional TOC needs at this time.

## 2022-10-17 NOTE — ED Triage Notes (Signed)
Pt. Stated, Thursday night I started having stomach pain with nausea but started having diarrhea and it hasn't stopped. Now I have blood all in my stool. I have had some fever . Ive taken Imodium and Tylenol

## 2022-10-17 NOTE — Telephone Encounter (Cosign Needed)
I had to resend the prescription for Percocet as the initial one did not have the right quantity.

## 2022-10-17 NOTE — ED Provider Notes (Signed)
EMERGENCY DEPARTMENT AT [facility] Provider Note   CSN: [id number] Arrival date & time: 10/17/22  0725     History Chief Complaint  Patient presents with   Abdominal Pain   Diarrhea     Abdominal Pain Associated symptoms: diarrhea   Diarrhea Associated symptoms: abdominal pain    Greg Clark is a 52 y.o. male presenting for abdominal pain and fever.  He reports 4 days of intermittent cramping abdominal pain, localized in the navel area that moves to the left abdomen. He also reports nausea, bloody bowel movement for 2 days. He denies vomiting.  He has never had this before.Patient denies sick contacts. Abdominal pain [place]. He has a prior history of umbilical surgery in 2021.   Patient's recorded medical, surgical, social, medication list and allergies were reviewed in the Snapshot window as part of the initial history.   Review of Systems   Review of Systems  Gastrointestinal:  Positive for abdominal pain and diarrhea.    Physical Exam Updated Vital Signs BP (!) 146/91 (BP Location: Right Arm)   Pulse 75   Temp 98.1 F (36.7 C) (Oral)   Resp 18   Ht 5\' 8"  (1.727 m)   Wt 88.5 kg   SpO2 100%   BMI 29.65 kg/m  Physical Exam General: Pleasant, not in acute distress.   CV: RRR. No murmurs, rubs, or gallops. No LE edema Pulmonary: Lungs CTAB. Normal effort. No wheezing or rales. Abdominal: Soft, tender in the navel area. Normal bowel sounds. Extremities: Palpable pulses. Normal ROM. Skin: Warm and dry. No obvious rash or lesions.   ED Course/ Medical Decision Making/ A&P    Procedures Procedures   Medications Ordered in ED Medications  ondansetron (ZOFRAN) injection 4 mg (4 mg Intravenous Patient Refused/Not Given 10/17/22 1454)  morphine (PF) 4 MG/ML injection 4 mg (4 mg Intravenous Patient Refused/Not Given 10/17/22 1454)  iohexol (OMNIPAQUE) 350 MG/ML injection 75 mL (75 mLs Intravenous Contrast  Given 10/17/22 1029)    Medical Decision Making:    Greg Clark is a 52 y.o. male who presented to the ED today with BRBPR detailed above.     Patient placed on continuous vitals and telemetry monitoring while in ED which was reviewed periodically.   Complete initial physical exam performed, notably the patient  was stable.  He is tender in the navel area. No signs of peritonitis.  Reviewed and confirmed nursing documentation for past medical history, family history, social history.    Initial Assessment:   With the patient's presentation of BRBPR, most likely diagnosis is gastroenteritis vs. Colitis. Due to patient past surgical history SBO vs peritonitis is also on the differential.  WBC is normal.  These are considered less likely due to history of present illness and physical exam findings.   This is most consistent with an acute life/limb threatening illness complicated by underlying chronic conditions.  Initial Plan:  Screening labs including CBC and Metabolic panel to evaluate for infectious or metabolic etiology of disease.  UA Lipase CT abdomen and pelvis with contrast Stool culture Morphine 4 mg for pain Zofran for nausea.   Objective evaluation as below reviewed with plan for close reassessment  Initial Study Results:   Laboratory  All laboratory results reviewed without evidence of clinically relevant pathology.     Radiology  All images reviewed independently. Agree with radiology report at this time.   CT ABDOMEN PELVIS W CONTRAST  Result  Date: 10/17/2022 CLINICAL DATA:  Abdominal pain, nausea, and diarrhea, now with hematochezia EXAM: CT ABDOMEN AND PELVIS WITH CONTRAST TECHNIQUE: Multidetector CT imaging of the abdomen and pelvis was performed using the standard protocol following bolus administration of intravenous contrast. RADIATION DOSE REDUCTION: This exam was performed according to the departmental dose-optimization program which includes automated  exposure control, adjustment of the mA and/or kV according to patient size and/or use of iterative reconstruction technique. CONTRAST:  75mL OMNIPAQUE IOHEXOL 350 MG/ML SOLN COMPARISON:  CT abdomen and pelvis dated 04/22/2015 FINDINGS: Lower chest: No focal consolidation or pulmonary nodule in the lung bases. No pleural effusion or pneumothorax demonstrated. Partially imaged heart size is normal. Hepatobiliary: No focal hepatic lesions. No intra or extrahepatic biliary ductal dilation. Normal gallbladder. Pancreas: No focal lesions or main ductal dilation. Spleen: Normal in size without focal abnormality. Adrenals/Urinary Tract: No adrenal nodules. No suspicious renal mass, calculi or hydronephrosis. No focal bladder wall thickening. Stomach/Bowel: Normal appearance of the stomach. Diffusely underdistended colon demonstrates circumferential mural thickening from the mid transverse colon to the rectosigmoid colon, where there is mucosal hyperenhancement. No abnormal bowel dilation. Normal appendix. Vascular/Lymphatic: Aortic atherosclerosis. No enlarged abdominal or pelvic lymph nodes. Reproductive: Prostate is unremarkable. Other: Trace free fluid.  No free air or fluid collection. Musculoskeletal: No acute or abnormal lytic or blastic osseous lesions. IMPRESSION: 1. Findings of colitis involving the mid transverse colon to the rectosigmoid colon, which may be infectious or inflammatory in etiology. 2.  Aortic Atherosclerosis (ICD10-I70.0). Electronically Signed   By: Agustin Cree M.D.   On: 10/17/2022 11:04      Reassessment and Plan:   Both  WBC and Hb within normal limits. He is hemoccult negative. CT of the abdomen and pelvis shows evidence of colitis involving the mid transverse colon to the rectosigmoid region, which may be infectious or inflammatory in etiology.  I suspect the likely cause of patient's abdominal pain is a presumed infectious colitis.     -Although CT findings are nonspecific for  diverticulitis, patient will complete 7-day course of Augmentin 75 mg.  He was discharged with Percocet for intermittent cramping and  Zofran for nausea.    Clinical Impression:  1. Colitis presumed infectious      Discharge   Final Clinical Impression(s) / ED Diagnoses Final diagnoses:  Colitis presumed infectious    Rx / DC Orders ED Discharge Orders          Ordered    oxyCODONE-acetaminophen (PERCOCET/ROXICET) 5-325 MG tablet  Every 6 hours PRN        10/17/22 1424    ondansetron (ZOFRAN) 4 MG tablet  Every 6 hours        10/17/22 1425              Laretta Bolster, MD 10/17/22 1455    Tegeler, Canary Brim, MD 10/18/22 1708

## 2022-10-18 ENCOUNTER — Telehealth (HOSPITAL_COMMUNITY): Payer: Self-pay | Admitting: Emergency Medicine

## 2022-10-18 LAB — GASTROINTESTINAL PANEL BY PCR, STOOL (REPLACES STOOL CULTURE)
Adenovirus F40/41: NOT DETECTED
Astrovirus: NOT DETECTED
Campylobacter species: NOT DETECTED
Cryptosporidium: NOT DETECTED
Cyclospora cayetanensis: NOT DETECTED
Entamoeba histolytica: NOT DETECTED
Enteroaggregative E coli (EAEC): NOT DETECTED
Enteropathogenic E coli (EPEC): DETECTED — AB
Enterotoxigenic E coli (ETEC): NOT DETECTED
Giardia lamblia: NOT DETECTED
Norovirus GI/GII: NOT DETECTED
Plesimonas shigelloides: NOT DETECTED
Rotavirus A: DETECTED — AB
Salmonella species: DETECTED — AB
Sapovirus (I, II, IV, and V): NOT DETECTED
Shiga like toxin producing E coli (STEC): NOT DETECTED
Shigella/Enteroinvasive E coli (EIEC): NOT DETECTED
Vibrio cholerae: NOT DETECTED
Vibrio species: NOT DETECTED
Yersinia enterocolitica: NOT DETECTED

## 2022-10-18 MED ORDER — CIPROFLOXACIN HCL 250 MG PO TABS: 250.0000 mg | ORAL_TABLET | Freq: Two times a day (BID) | ORAL | 0 refills | Status: AC

## 2022-10-18 NOTE — Telephone Encounter (Signed)
Informed by lab that patient was positive for e coli on his culture. It appears it was a gi pathogen panel. He was d/c on augmentin for presumed diverticulitis. After discussing with pharmacy will call in Rx for Cipro. Called patient to inform him and no answer. Will attempt to call back in a couple hours.   6:52 AM Attempted to call patient again, no answer. VM to call back left.   7:20 AM  Patient called back. Informed him of results and change to treatment plan.

## 2022-10-18 NOTE — ED Provider Notes (Signed)
In reference to telephone call/note earlier:  Patient called back. I let him know to continue augmentin, start cipro and common side effects of cipro and when to follow up with PCP and/or [place] if needed.    Jeyson Deshotel, Barbara Cower, MD 10/18/22 [phone]

## 2024-05-16 ENCOUNTER — Ambulatory Visit

## 2024-05-16 ENCOUNTER — Ambulatory Visit: Admitting: Podiatry

## 2024-05-16 DIAGNOSIS — M21621 Bunionette of right foot: Secondary | ICD-10-CM

## 2024-05-16 DIAGNOSIS — D3610 Benign neoplasm of peripheral nerves and autonomic nervous system, unspecified: Secondary | ICD-10-CM

## 2024-05-16 DIAGNOSIS — G5761 Lesion of plantar nerve, right lower limb: Secondary | ICD-10-CM

## 2024-05-16 MED ORDER — PREDNISONE 5 MG PO TABS: ORAL_TABLET | ORAL | 0 refills | Status: AC

## 2024-05-16 NOTE — Progress Notes (Signed)
 dg

## 2024-05-16 NOTE — Progress Notes (Signed)
 Patient presents with complaint of a clicking sensation of the fifth MTP on the right.  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  Injured the toe 7 or 8 months ago he hit it into something and it was painful.  Seem to notice it started then.  Says it tingles sometimes.  Also hit the great toe several months ago and has been a little bit numb since then.  Physical  Exam:  General appearance: Pleasant, and in no acute distress. AOx3.  Vascular: Pedal pulses: DP 2/4 bilaterally, PT 2/4 bilaterally.  Mild edema lower legs bilaterally. Capillary fill time immediate bilaterally.  Neurological: Light touch intact feet bilaterally.  Normal Achilles reflex bilaterally.  No clonus or spasticity noted.  Negative Tinel sign tarsal tunnel porta pedis and sural nerve.  Monofilament sensation intact toes 1 through 5 bilaterally.  Vibratory sensation intact feet bilaterally.  Some tingling with pressure on the plantar lateral proper nerve over the plantar lateral aspect of the fifth MTP right.  Dermatologic:   Skin normal temperature bilaterally.  Skin normal color, tone, and texture bilaterally.   Musculoskeletal: Tailors bunion deformity bilaterally. Mild hallux valgus deformity.  Mild hammertoe deformity fifth toes bilaterally.  No tenderness to palpation on the fifth toe.  Radiographs: 3 views right foot: It was bunion deformed with increased fourth fifth intermetatarsal angle and lateral bowing of the fifth metatarsal.  Mild HAV deformity.  Normal bone density no evidence of any fractures or dislocations.  No arthritic changes noted.   Diagnosis: 1.  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  Tailor's bunion deformity right.  Plan: -New patient office visit for evaluation and management level 3. - Discussed with him the area of the thickening of the nerve.  Hard to say if it is due from the injury sustained right fifth toe or if it was just coincidental  with the tailor's bunion irritating the nerve.  Will try some prednisone  and an OTC insole to see if this reduces the problem.  Could try an injection also.  If it does continue to bother him he may require correction of the tailor's bunion deformity. -Dispensed OTC foot orthoses bilaterally. - Rx prednisone  5 mg, 30 mg p.o. daily first day, then decrease by 5 mg every other day for 12 days.   Return 3 weeks follow-up lateral neuroma right
